# Patient Record
Sex: Female | Born: 1970 | Race: White | Hispanic: No | Marital: Married | Smoking: Never smoker
Health system: Southern US, Community
[De-identification: ages and names within clinical notes are randomized; demographics above are authoritative.]

## PROBLEM LIST (undated history)

## (undated) DIAGNOSIS — R627 Adult failure to thrive: Secondary | ICD-10-CM

## (undated) DIAGNOSIS — F419 Anxiety disorder, unspecified: Secondary | ICD-10-CM

## (undated) DIAGNOSIS — D6861 Antiphospholipid syndrome: Secondary | ICD-10-CM

## (undated) DIAGNOSIS — R1310 Dysphagia, unspecified: Secondary | ICD-10-CM

## (undated) DIAGNOSIS — F32A Depression, unspecified: Secondary | ICD-10-CM

## (undated) DIAGNOSIS — G43009 Migraine without aura, not intractable, without status migrainosus: Secondary | ICD-10-CM

## (undated) DIAGNOSIS — R490 Dysphonia: Secondary | ICD-10-CM

## (undated) DIAGNOSIS — I639 Cerebral infarction, unspecified: Secondary | ICD-10-CM

## (undated) DIAGNOSIS — M329 Systemic lupus erythematosus, unspecified: Secondary | ICD-10-CM

---

## 2020-10-05 ENCOUNTER — Other Ambulatory Visit: Payer: Self-pay

## 2020-10-05 ENCOUNTER — Emergency Department: Payer: Medicare FFS

## 2020-10-05 ENCOUNTER — Inpatient Hospital Stay
Admission: EM | Admit: 2020-10-05 | Discharge: 2020-10-06 | DRG: 308 | Disposition: A | Discharge status: Other Acute Inpatient Hospital | Payer: Medicare FFS | Attending: Internal Medicine | Admitting: Internal Medicine

## 2020-10-05 ENCOUNTER — Encounter: Payer: Self-pay | Admitting: *Deleted

## 2020-10-05 ENCOUNTER — Encounter
Admission: EM | Disposition: A | Discharge status: Other Acute Inpatient Hospital | Payer: Self-pay | Source: Home / Self Care | Attending: Internal Medicine

## 2020-10-05 DIAGNOSIS — J383 Other diseases of vocal cords: Secondary | ICD-10-CM | POA: Diagnosis present

## 2020-10-05 DIAGNOSIS — I442 Atrioventricular block, complete: Secondary | ICD-10-CM | POA: Diagnosis not present

## 2020-10-05 DIAGNOSIS — Z86718 Personal history of other venous thrombosis and embolism: Secondary | ICD-10-CM | POA: Diagnosis not present

## 2020-10-05 DIAGNOSIS — Z8774 Personal history of (corrected) congenital malformations of heart and circulatory system: Secondary | ICD-10-CM | POA: Diagnosis not present

## 2020-10-05 DIAGNOSIS — R570 Cardiogenic shock: Secondary | ICD-10-CM | POA: Diagnosis present

## 2020-10-05 DIAGNOSIS — Z20822 Contact with and (suspected) exposure to covid-19: Secondary | ICD-10-CM | POA: Diagnosis present

## 2020-10-05 DIAGNOSIS — E860 Dehydration: Secondary | ICD-10-CM | POA: Diagnosis present

## 2020-10-05 DIAGNOSIS — R7989 Other specified abnormal findings of blood chemistry: Secondary | ICD-10-CM | POA: Diagnosis present

## 2020-10-05 DIAGNOSIS — G9349 Other encephalopathy: Secondary | ICD-10-CM | POA: Diagnosis present

## 2020-10-05 DIAGNOSIS — R627 Adult failure to thrive: Secondary | ICD-10-CM | POA: Diagnosis present

## 2020-10-05 DIAGNOSIS — R159 Full incontinence of feces: Secondary | ICD-10-CM | POA: Diagnosis present

## 2020-10-05 DIAGNOSIS — R1310 Dysphagia, unspecified: Secondary | ICD-10-CM | POA: Diagnosis present

## 2020-10-05 DIAGNOSIS — E43 Unspecified severe protein-calorie malnutrition: Secondary | ICD-10-CM | POA: Diagnosis present

## 2020-10-05 DIAGNOSIS — R197 Diarrhea, unspecified: Secondary | ICD-10-CM | POA: Diagnosis present

## 2020-10-05 DIAGNOSIS — R490 Dysphonia: Secondary | ICD-10-CM | POA: Diagnosis present

## 2020-10-05 DIAGNOSIS — M797 Fibromyalgia: Secondary | ICD-10-CM | POA: Diagnosis present

## 2020-10-05 DIAGNOSIS — D6861 Antiphospholipid syndrome: Secondary | ICD-10-CM | POA: Diagnosis present

## 2020-10-05 DIAGNOSIS — Z931 Gastrostomy status: Secondary | ICD-10-CM

## 2020-10-05 DIAGNOSIS — T829XXA Unspecified complication of cardiac and vascular prosthetic device, implant and graft, initial encounter: Secondary | ICD-10-CM

## 2020-10-05 DIAGNOSIS — R64 Cachexia: Secondary | ICD-10-CM | POA: Diagnosis present

## 2020-10-05 DIAGNOSIS — F32A Depression, unspecified: Secondary | ICD-10-CM | POA: Diagnosis present

## 2020-10-05 DIAGNOSIS — F419 Anxiety disorder, unspecified: Secondary | ICD-10-CM | POA: Diagnosis present

## 2020-10-05 DIAGNOSIS — Z0189 Encounter for other specified special examinations: Secondary | ICD-10-CM

## 2020-10-05 DIAGNOSIS — Z681 Body mass index (BMI) 19 or less, adult: Secondary | ICD-10-CM

## 2020-10-05 DIAGNOSIS — M1990 Unspecified osteoarthritis, unspecified site: Secondary | ICD-10-CM | POA: Diagnosis present

## 2020-10-05 DIAGNOSIS — Z7901 Long term (current) use of anticoagulants: Secondary | ICD-10-CM | POA: Diagnosis not present

## 2020-10-05 DIAGNOSIS — Z8673 Personal history of transient ischemic attack (TIA), and cerebral infarction without residual deficits: Secondary | ICD-10-CM | POA: Diagnosis not present

## 2020-10-05 DIAGNOSIS — I248 Other forms of acute ischemic heart disease: Secondary | ICD-10-CM | POA: Diagnosis present

## 2020-10-05 DIAGNOSIS — M329 Systemic lupus erythematosus, unspecified: Secondary | ICD-10-CM | POA: Diagnosis present

## 2020-10-05 DIAGNOSIS — R1110 Vomiting, unspecified: Secondary | ICD-10-CM | POA: Diagnosis present

## 2020-10-05 HISTORY — DX: Depression, unspecified: F32.A

## 2020-10-05 HISTORY — DX: Adult failure to thrive: R62.7

## 2020-10-05 HISTORY — DX: Dysphonia: R49.0

## 2020-10-05 HISTORY — DX: Dysphagia, unspecified: R13.10

## 2020-10-05 HISTORY — DX: Cerebral infarction, unspecified: I63.9

## 2020-10-05 HISTORY — DX: Migraine without aura, not intractable, without status migrainosus: G43.009

## 2020-10-05 HISTORY — DX: Anxiety disorder, unspecified: F41.9

## 2020-10-05 HISTORY — DX: Systemic lupus erythematosus, unspecified: M32.9

## 2020-10-05 HISTORY — DX: Antiphospholipid syndrome: D68.61

## 2020-10-05 HISTORY — PX: TEMPORARY PACEMAKER: CATH118268

## 2020-10-05 LAB — CBC WITH DIFFERENTIAL/PLATELET
Abs Immature Granulocytes: 0.05 10*3/uL (ref 0.00–0.07)
Basophils Absolute: 0 10*3/uL (ref 0.0–0.1)
Basophils Relative: 0 %
Eosinophils Absolute: 0 10*3/uL (ref 0.0–0.5)
Eosinophils Relative: 0 %
HCT: 35.8 % — ABNORMAL LOW (ref 36.0–46.0)
Hemoglobin: 11.9 g/dL — ABNORMAL LOW (ref 12.0–15.0)
Immature Granulocytes: 1 %
Lymphocytes Relative: 34 %
Lymphs Abs: 3.3 10*3/uL (ref 0.7–4.0)
MCH: 33 pg (ref 26.0–34.0)
MCHC: 33.2 g/dL (ref 30.0–36.0)
MCV: 99.2 fL (ref 80.0–100.0)
Monocytes Absolute: 1.1 10*3/uL — ABNORMAL HIGH (ref 0.1–1.0)
Monocytes Relative: 11 %
Neutro Abs: 5.3 10*3/uL (ref 1.7–7.7)
Neutrophils Relative %: 54 %
Platelets: 231 10*3/uL (ref 150–400)
RBC: 3.61 MIL/uL — ABNORMAL LOW (ref 3.87–5.11)
RDW: 13.5 % (ref 11.5–15.5)
Smear Review: NORMAL
WBC: 9.9 10*3/uL (ref 4.0–10.5)
nRBC: 0 % (ref 0.0–0.2)

## 2020-10-05 LAB — PROTIME-INR
INR: 1.6 — ABNORMAL HIGH (ref 0.8–1.2)
Prothrombin Time: 18.8 seconds — ABNORMAL HIGH (ref 11.4–15.2)

## 2020-10-05 LAB — URINALYSIS, COMPLETE (UACMP) WITH MICROSCOPIC
Bilirubin Urine: NEGATIVE
Glucose, UA: NEGATIVE mg/dL
Hgb urine dipstick: NEGATIVE
Ketones, ur: NEGATIVE mg/dL
Leukocytes,Ua: NEGATIVE
Nitrite: NEGATIVE
Protein, ur: 100 mg/dL — AB
Specific Gravity, Urine: 1.017 (ref 1.005–1.030)
pH: 7 (ref 5.0–8.0)

## 2020-10-05 LAB — COMPREHENSIVE METABOLIC PANEL
ALT: 108 U/L — ABNORMAL HIGH (ref 0–44)
AST: 73 U/L — ABNORMAL HIGH (ref 15–41)
Albumin: 3.2 g/dL — ABNORMAL LOW (ref 3.5–5.0)
Alkaline Phosphatase: 57 U/L (ref 38–126)
Anion gap: 7 (ref 5–15)
BUN: 25 mg/dL — ABNORMAL HIGH (ref 6–20)
CO2: 22 mmol/L (ref 22–32)
Calcium: 8.5 mg/dL — ABNORMAL LOW (ref 8.9–10.3)
Chloride: 105 mmol/L (ref 98–111)
Creatinine, Ser: 0.73 mg/dL (ref 0.44–1.00)
GFR, Estimated: >60 mL/min (ref >60)
Glucose, Bld: 182 mg/dL — ABNORMAL HIGH (ref 70–99)
Potassium: 4.1 mmol/L (ref 3.5–5.1)
Sodium: 134 mmol/L — ABNORMAL LOW (ref 135–145)
Total Bilirubin: 0.6 mg/dL (ref 0.3–1.2)
Total Protein: 5.5 g/dL — ABNORMAL LOW (ref 6.5–8.1)

## 2020-10-05 LAB — URINE DRUG SCREEN, QUALITATIVE (ARMC ONLY)
Amphetamines, Ur Screen: NOT DETECTED
Barbiturates, Ur Screen: NOT DETECTED
Benzodiazepine, Ur Scrn: NOT DETECTED
Cannabinoid 50 Ng, Ur ~~LOC~~: NOT DETECTED
Cocaine Metabolite,Ur ~~LOC~~: NOT DETECTED
MDMA (Ecstasy)Ur Screen: NOT DETECTED
Methadone Scn, Ur: NOT DETECTED
Opiate, Ur Screen: NOT DETECTED
Phencyclidine (PCP) Ur S: NOT DETECTED
Tricyclic, Ur Screen: NOT DETECTED

## 2020-10-05 LAB — TROPONIN I (HIGH SENSITIVITY): Troponin I (High Sensitivity): 90 ng/L — ABNORMAL HIGH (ref <18)

## 2020-10-05 LAB — SALICYLATE LEVEL: Salicylate Lvl: <7 mg/dL — ABNORMAL LOW (ref 7.0–30.0)

## 2020-10-05 SURGERY — TEMPORARY PACEMAKER

## 2020-10-05 MED ORDER — POLYETHYLENE GLYCOL 3350 17 G PO PACK: 17.0000 g | PACK | Freq: Every day | ORAL | Status: DC | PRN

## 2020-10-05 MED ORDER — FENTANYL CITRATE (PF) 100 MCG/2ML IJ SOLN: 50.0000 ug | INTRAMUSCULAR | Status: DC | PRN

## 2020-10-05 MED ORDER — ONDANSETRON HCL 4 MG/2ML IJ SOLN
INTRAMUSCULAR | Status: AC
  Administered 2020-10-05: 4 mg via INTRAVENOUS
  Filled 2020-10-05: qty 4

## 2020-10-05 MED ORDER — DOCUSATE SODIUM 100 MG PO CAPS
100.0000 mg | ORAL_CAPSULE | Freq: Two times a day (BID) | ORAL | Status: DC | PRN
  Filled 2020-10-05: qty 1

## 2020-10-05 MED ORDER — FENTANYL CITRATE (PF) 100 MCG/2ML IJ SOLN
50.0000 ug | INTRAMUSCULAR | Status: DC | PRN
  Filled 2020-10-05: qty 2

## 2020-10-05 MED ORDER — FAMOTIDINE IN NACL 20-0.9 MG/50ML-% IV SOLN
20.0000 mg | Freq: Two times a day (BID) | INTRAVENOUS | Status: DC
  Administered 2020-10-06 (×2): 20 mg via INTRAVENOUS
  Filled 2020-10-05 (×2): qty 50

## 2020-10-05 MED ORDER — SODIUM BICARBONATE 8.4 % IV SOLN
INTRAVENOUS | Status: AC | PRN
  Administered 2020-10-05: 50 meq via INTRAVENOUS

## 2020-10-05 MED ORDER — FENTANYL 2500MCG IN NS 250ML (10MCG/ML) PREMIX INFUSION
0.0000 ug/h | INTRAVENOUS | Status: DC
  Administered 2020-10-05: 25 ug/h via INTRAVENOUS
  Administered 2020-10-06: 75 ug/h via INTRAVENOUS
  Filled 2020-10-05: qty 250

## 2020-10-05 MED ORDER — ATROPINE SULFATE 1 MG/ML IJ SOLN
INTRAMUSCULAR | Status: AC | PRN
  Administered 2020-10-05: 1 mg via INTRAVENOUS

## 2020-10-05 MED ORDER — LORAZEPAM 2 MG/ML IJ SOLN
2.0000 mg | Freq: Once | INTRAMUSCULAR | Status: DC
  Filled 2020-10-05: qty 1

## 2020-10-05 MED ORDER — LIDOCAINE HCL (PF) 1 % IJ SOLN
INTRAMUSCULAR | Status: AC
  Filled 2020-10-05: qty 30

## 2020-10-05 MED ORDER — PROPOFOL 1000 MG/100ML IV EMUL: 0.0000 ug/kg/min | INTRAVENOUS | Status: DC

## 2020-10-05 MED ORDER — LORAZEPAM 2 MG/ML IJ SOLN: 2.0000 mg | INTRAMUSCULAR | Status: DC | PRN

## 2020-10-05 MED ORDER — FENTANYL CITRATE (PF) 100 MCG/2ML IJ SOLN
INTRAMUSCULAR | Status: AC | PRN
  Administered 2020-10-05: 50 ug via INTRAVENOUS

## 2020-10-05 MED ORDER — SODIUM CHLORIDE 0.9 % IV SOLN
INTRAVENOUS | Status: AC | PRN
  Administered 2020-10-05: 2000 mL via INTRAVENOUS

## 2020-10-05 MED ORDER — DOPAMINE-DEXTROSE 3.2-5 MG/ML-% IV SOLN
INTRAVENOUS | Status: AC
  Filled 2020-10-05: qty 250

## 2020-10-05 MED ORDER — ROCURONIUM BROMIDE 50 MG/5ML IV SOLN
INTRAVENOUS | Status: AC | PRN
  Administered 2020-10-05: 50 mg via INTRAVENOUS

## 2020-10-05 MED ORDER — LIDOCAINE HCL (PF) 1 % IJ SOLN
INTRAMUSCULAR | Status: DC | PRN
  Administered 2020-10-05: 20 mL
  Administered 2020-10-06: 10 mL

## 2020-10-05 MED ORDER — NOREPINEPHRINE 4 MG/250ML-% IV SOLN
INTRAVENOUS | Status: AC
  Filled 2020-10-05: qty 250

## 2020-10-05 MED ORDER — ETOMIDATE 2 MG/ML IV SOLN
INTRAVENOUS | Status: AC | PRN
  Administered 2020-10-05: 15 mg via INTRAVENOUS

## 2020-10-05 MED ORDER — CALCIUM CHLORIDE 10 % IV SOLN
INTRAVENOUS | Status: AC | PRN
  Administered 2020-10-05: 1 g via INTRAVENOUS

## 2020-10-05 MED ORDER — ONDANSETRON HCL 4 MG/2ML IJ SOLN: 4.0000 mg | Freq: Once | INTRAMUSCULAR | Status: AC

## 2020-10-05 SURGICAL SUPPLY — 6 items
CABLE ADAPT PACING TEMP 12FT (ADAPTER) ×4 IMPLANT
NEEDLE PERC 18GX7CM (NEEDLE) ×2 IMPLANT
PACK CARDIAC CATH (CUSTOM PROCEDURE TRAY) ×2 IMPLANT
SHEATH AVANTI 6FR X 11CM (SHEATH) ×4 IMPLANT
SLEEVE REPOSITIONING LENGTH 30 (MISCELLANEOUS) ×2 IMPLANT
WIRE PACING TEMP ST TIP 5 (CATHETERS) ×4 IMPLANT

## 2020-10-05 NOTE — ED Notes (Signed)
Patient transported to CT 

## 2020-10-05 NOTE — ED Provider Notes (Signed)
[facility] Emergency Department Provider Note  ____________________________________________  Time seen: Approximately 10:02 PM  I have reviewed the triage vital signs and the nursing notes.   HISTORY  Chief Complaint No chief complaint on file.    Level 5 Caveat: Portions of the History and Physical including HPI and review of systems are unable to be completely obtained due to acute critical illness and altered mental status  HPI Mallory Riley is a 50 y.o. female who was brought to the ED for shortness of breath and chest pain that started this afternoon rather abruptly.  She has been having malaise and diarrhea previous to that.  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  She has a PEG tube and is chronically underweight.  She is on warfarin for unclear reasons.  No known fall or trauma.  Normally she is ambulatory and independent with ADLs.    No past medical history on file.   Patient Active Problem List   Diagnosis Date Noted   Complete heart block (HCC) 10/05/2020        Prior to Admission medications   Not on File  Warfarin   Allergies Patient has no known allergies.   No family history on file.  Social History Social History   Tobacco Use   Smoking status: Never   Smokeless tobacco: Never  Substance Use Topics   Alcohol use: Never   Drug use: Never    Review of Systems Level 5 Caveat: Portions of the History and Physical including HPI and review of systems are unable to be completely obtained due to patient being a poor historian   Constitutional:   No known fever.  ENT:   No rhinorrhea. Cardiovascular:   Positive chest pain without syncope. Respiratory:   Positive shortness of breath without cough. Gastrointestinal:   Negative for abdominal pain, vomiting and diarrhea.  Musculoskeletal:   Negative for focal pain or swelling ____________________________________________   PHYSICAL EXAM:  VITAL  SIGNS: ED Triage Vitals  Enc Vitals Group     BP 10/05/20 2118 (!) 69/49     Pulse Rate 10/05/20 2137 (!) 31     Resp 10/05/20 2118 (!) 24     Temp 10/05/20 2118 98.3 F (36.8 C)     Temp Source 10/05/20 2118 Oral     SpO2 10/05/20 2137 100 %     Weight 10/05/20 2119 90 lb (40.8 kg)     Height 10/05/20 2119 5\' 4"  (1.626 m)     Head Circumference --      Peak Flow --      Pain Score --      Pain Loc --      Pain Edu? --      Excl. in GC? --     Vital signs reviewed, nursing assessments reviewed.   Constitutional:   Not alert, not oriented, ill-appearing and in extremis.  Cachectic Eyes:   Conjunctivae are normal. EOMI. PERRL. ENT      Head:   Normocephalic and atraumatic.      Nose:   No congestion/rhinnorhea.       Mouth/Throat:   Dry mucous membranes, no pharyngeal erythema. No peritonsillar mass.       Neck:   No meningismus. Full ROM. Hematological/Lymphatic/Immunilogical:   No cervical lymphadenopathy. Cardiovascular:   Bradycardia heart rate 20. Symmetric bilateral thready radial pulses.  Extremities are pale and cool to the touch. Respiratory:   Tachypnea, heart rate 24, breath sounds clear  bilaterally. Gastrointestinal:   Soft and nontender.  PEG tube in position, skin entry site noninflamed.  Non distended.   No rebound, rigidity, or guarding. Genitourinary:   Normal Musculoskeletal:   Normal range of motion in all extremities. No joint effusions.  No lower extremity tenderness.  No edema. Neurologic:   Groaning, not able to follow commands Motor grossly intact.  Skin:    Skin is cool, dry and intact. No rash noted.  No petechiae, purpura, or bullae.  ____________________________________________    LABS (pertinent positives/negatives) (all labs ordered are listed, but only abnormal results are displayed) Labs Reviewed  COMPREHENSIVE METABOLIC PANEL - Abnormal; Notable for the following components:      Result Value   Sodium 134 (*)    Glucose, Bld 182 (*)     BUN 25 (*)    Calcium 8.5 (*)    Total Protein 5.5 (*)    Albumin 3.2 (*)    AST 73 (*)    ALT 108 (*)    All other components within normal limits  PROTIME-INR - Abnormal; Notable for the following components:   Prothrombin Time 18.8 (*)    INR 1.6 (*)    All other components within normal limits  SALICYLATE LEVEL - Abnormal; Notable for the following components:   Salicylate Lvl <7.0 (*)    All other components within normal limits  CBC WITH DIFFERENTIAL/PLATELET - Abnormal; Notable for the following components:   RBC 3.61 (*)    Hemoglobin 11.9 (*)    HCT 35.8 (*)    Monocytes Absolute 1.1 (*)    All other components within normal limits  URINALYSIS, COMPLETE (UACMP) WITH MICROSCOPIC - Abnormal; Notable for the following components:   Color, Urine YELLOW (*)    APPearance CLOUDY (*)    Protein, ur 100 (*)    Bacteria, UA RARE (*)    All other components within normal limits  TROPONIN I (HIGH SENSITIVITY) - Abnormal; Notable for the following components:   Troponin I (High Sensitivity) 90 (*)    All other components within normal limits  RESP PANEL BY RT-PCR (FLU A&B, COVID) ARPGX2  URINE DRUG SCREEN, QUALITATIVE ([facility] ONLY)  HIV ANTIBODY (ROUTINE TESTING W REFLEX)  CBC  BASIC METABOLIC PANEL  BLOOD GAS, ARTERIAL  MAGNESIUM  PHOSPHORUS  TRIGLYCERIDES  TROPONIN I (HIGH SENSITIVITY)   ____________________________________________   EKG  Interpreted by me Complete heart block with ventricular rate of 24.  Artifact and sparse complexes limit interpretation.  There is inferior ST depression.  No clear STEMI.  ____________________________________________    RADIOLOGY  DG Abd 1 View  Result Date: 10/05/2020 CLINICAL DATA:  Shortness of breath, chest pain.  NG tube placement EXAM: ABDOMEN - 1 VIEW COMPARISON:  None. FINDINGS: NG tube tip is in the stomach. Gastrostomy tube projects over the stomach. Lung bases clear. IMPRESSION: NG tube tip in the stomach.  Electronically Signed   By: Charlett Nose M.D.   On: 10/05/2020 22:14   CT Head Wo Contrast  Result Date: 10/05/2020 CLINICAL DATA:  Encephalopathy EXAM: CT HEAD WITHOUT CONTRAST TECHNIQUE: Contiguous axial images were obtained from the base of the skull through the vertex without intravenous contrast. COMPARISON:  None. FINDINGS: Brain: There is no mass, hemorrhage or extra-axial collection. The size and configuration of the ventricles and extra-axial CSF spaces are normal. There is hypoattenuation of the white matter, most commonly indicating chronic small vessel disease. Vascular: No abnormal hyperdensity of the major intracranial arteries or dural venous sinuses.  No intracranial atherosclerosis. Skull: The visualized skull base, calvarium and extracranial soft tissues are normal. Sinuses/Orbits: No fluid levels or advanced mucosal thickening of the visualized paranasal sinuses. No mastoid or middle ear effusion. The orbits are normal. IMPRESSION: Chronic small vessel disease without acute intracranial abnormality. Electronically Signed   By: Deatra RobinsonKevin  Herman M.D.   On: 10/05/2020 22:18   DG Chest Portable 1 View  Result Date: 10/05/2020 CLINICAL DATA:  Intubation.  Shortness of breath, chest pain EXAM: PORTABLE CHEST 1 VIEW COMPARISON:  None. FINDINGS: Endotracheal tube 5 cm above the carina. NG tube is in the stomach. Heart is normal size. No confluent opacities or effusions. No acute bony abnormality. IMPRESSION: No acute cardiopulmonary disease. Electronically Signed   By: Charlett NoseKevin  Dover M.D.   On: 10/05/2020 22:13    ____________________________________________   PROCEDURES .Critical Care  Date/Time: 10/05/2020 10:04 PM Performed by: Sharman CheekStafford, Quenisha Lovins, MD Authorized by: Sharman CheekStafford, Rockie Vawter, MD   Critical care provider statement:    Critical care time (minutes):  40   Critical care time was exclusive of:  Separately billable procedures and treating other patients   Critical care was necessary to  treat or prevent imminent or life-threatening deterioration of the following conditions:  Cardiac failure, circulatory failure, CNS failure or compromise and shock   Critical care was time spent personally by me on the following activities:  Development of treatment plan with patient or surrogate, discussions with consultants, evaluation of patient's response to treatment, examination of patient, obtaining history from patient or surrogate, ordering and performing treatments and interventions, ordering and review of laboratory studies, ordering and review of radiographic studies, pulse oximetry, re-evaluation of patient's condition and review of old charts Comments:        Procedure Name: Intubation Date/Time: 10/05/2020 10:04 PM Performed by: Sharman CheekStafford, Joh Rao, MD Pre-anesthesia Checklist: Patient identified, Patient being monitored, Emergency Drugs available, Timeout performed and Suction available Oxygen Delivery Method: Non-rebreather mask Preoxygenation: Pre-oxygenation with 100% oxygen Induction Type: Rapid sequence Ventilation: Mask ventilation without difficulty Laryngoscope Size: Glidescope and 3 Grade View: Grade I Tube type: Subglottic suction tube Tube size: 7.5 mm Number of attempts: 1 Placement Confirmation: ETT inserted through vocal cords under direct vision, CO2 detector and Breath sounds checked- equal and bilateral Secured at: 20 cm Tube secured with: ETT holder Dental Injury: Teeth and Oropharynx as per pre-operative assessment  Comments: Upper denture removed and saved for patient      External pacer  Date/Time: 10/05/2020 10:05 PM Performed by: Sharman CheekStafford, Wister Hoefle, MD Authorized by: Sharman CheekStafford, Davien Malone, MD  Consent: The procedure was performed in an emergent situation. Local anesthesia used: no  Anesthesia: Local anesthesia used: no  Sedation: Patient sedated: no  Patient tolerance: patient tolerated the procedure well with no immediate  complications Comments: Patient perimortem necessitating immediate pacing.  Given 50 mcg IV fentanyl.    ____________________________________________  DIFFERENTIAL DIAGNOSIS   ACS, complete heart block, electrolyte normality, dehydration, sepsis, intracranial hemorrhage  CLINICAL IMPRESSION / ASSESSMENT AND PLAN / ED COURSE  Medications ordered in the ED: Medications  LORazepam (ATIVAN) injection 2 mg ( Intravenous MAR Hold 10/05/20 2326)  fentaNYL 2500mcg in NS 250mL (5210mcg/ml) infusion-PREMIX (25 mcg/hr Intravenous New Bag/Given 10/05/20 2223)  LORazepam (ATIVAN) injection 2 mg ( Intravenous MAR Hold 10/05/20 2326)  lidocaine (PF) (XYLOCAINE) 1 % injection (10 mLs Infiltration Given 10/06/20 0010)  docusate sodium (COLACE) capsule 100 mg (has no administration in time range)  polyethylene glycol (MIRALAX / GLYCOLAX) packet 17 g (has no administration in time  range)  famotidine (PEPCID) IVPB 20 mg premix (has no administration in time range)  fentaNYL (SUBLIMAZE) injection 50 mcg (has no administration in time range)  fentaNYL (SUBLIMAZE) injection 50-200 mcg (has no administration in time range)  propofol (DIPRIVAN) 1000 MG/100ML infusion (has no administration in time range)  0.9 %  sodium chloride infusion ( Intravenous Stopped 10/05/20 2224)  atropine injection (1 mg Intravenous Given 10/05/20 2134)  sodium bicarbonate injection (50 mEq Intravenous Given 10/05/20 2136)  fentaNYL (SUBLIMAZE) injection (50 mcg Intravenous Given 10/05/20 2140)  calcium chloride injection (1 g Intravenous Given 10/05/20 2136)  ondansetron (ZOFRAN) injection 4 mg (4 mg Intravenous Given 10/05/20 2141)  etomidate (AMIDATE) injection (15 mg Intravenous Given 10/05/20 2145)  rocuronium (ZEMURON) injection (50 mg Intravenous Given 10/05/20 2146)    Pertinent labs & imaging results that were available during my care of the patient were reviewed by me and considered in my medical decision making (see chart for  details).   Mallory Riley was evaluated in Emergency Department on 10/06/2020 for the symptoms described in the history of present illness. She was evaluated in the context of the global COVID-19 pandemic, which necessitated consideration that the patient might be at risk for infection with the SARS-CoV-2 virus that causes COVID-19. Institutional protocols and algorithms that pertain to the evaluation of patients at risk for COVID-19 are in a state of rapid change based on information released by regulatory bodies including the CDC and federal and state organizations. These policies and algorithms were followed during the patient's care in the ED.     Clinical Course as of 10/06/20 0025  Wed Oct 05, 2020  2156 Patient arrived in extremis, EKG showing complete heart block with a ventricular rate about 20.  Patient was hypotensive with a blood pressure of 70/50, groaning and agonal.  Given immediate atropine, calcium and bicarb.  Started transcutaneous pacing with good capture at 70 mA for a peripheral pulse rate of 80 with normalization of blood pressure.  She did start to vomit, and with critical illness, she was intubated for airway protection.  My colleague Dr. Alfred Levins discussed the case with cardiology Dr. Juliann Pares who will plan for emergency transvenous pacer insertion.  When husband brings med list, it includes warfarin.  No history of fall or head trauma, but presentation necessitates CT head to rule out massive ICH. [PS]  2211 Chest and abdomen x-ray viewed and interpreted by me, lungs inflated, no pneumothorax or subcu emphysema.  ET tube terminates 4.5 cm above the carina.  OG tube projects over the stomach. [PS]  2218 CT head reviewed by me, no obvious ICH. [PS]    Clinical Course User Index [PS] Sharman Cheek, MD     ____________________________________________   FINAL CLINICAL IMPRESSION(S) / ED DIAGNOSES    Final diagnoses:  Heart block AV complete [facility])     ED Discharge  Orders     None       Portions of this note were generated with dragon dictation software. Dictation errors may occur despite best attempts at proofreading.   Sharman Cheek, MD 10/06/20 [place] Manners

## 2020-10-05 NOTE — ED Triage Notes (Signed)
Pt to triage via wheelchair.  Pt reports sob and chest pain.  Husband states diarrhea x 1   pt has a feeding tube.  Pt is pale and clammy.

## 2020-10-05 NOTE — ED Notes (Signed)
Pt has returned from CT without incident 

## 2020-10-05 NOTE — ED Notes (Signed)
2 ivs started  In triage.  Pt moaning out I can't breathe  Family with pt.

## 2020-10-05 NOTE — ED Notes (Signed)
Blue shirt, floral pants, pair of socks and upper denture removed from patient, placed in bag and given to husband.

## 2020-10-06 ENCOUNTER — Inpatient Hospital Stay
Admit: 2020-10-06 | Discharge: 2020-10-06 | Disposition: A | Discharge status: Home / Self Care | Payer: Medicare FFS | Attending: Nurse Practitioner | Admitting: Nurse Practitioner

## 2020-10-06 ENCOUNTER — Inpatient Hospital Stay: Payer: Medicare FFS

## 2020-10-06 ENCOUNTER — Encounter: Payer: Self-pay | Admitting: Internal Medicine

## 2020-10-06 ENCOUNTER — Inpatient Hospital Stay: Admit: 2020-10-06 | Payer: Medicare FFS

## 2020-10-06 ENCOUNTER — Encounter
Admission: EM | Disposition: A | Discharge status: Other Acute Inpatient Hospital | Payer: Self-pay | Source: Home / Self Care | Attending: Internal Medicine

## 2020-10-06 DIAGNOSIS — I442 Atrioventricular block, complete: Principal | ICD-10-CM

## 2020-10-06 HISTORY — PX: TEMPORARY PACEMAKER: CATH118268

## 2020-10-06 LAB — COMPREHENSIVE METABOLIC PANEL
ALT: 285 U/L — ABNORMAL HIGH (ref 0–44)
AST: 206 U/L — ABNORMAL HIGH (ref 15–41)
Albumin: 2.9 g/dL — ABNORMAL LOW (ref 3.5–5.0)
Alkaline Phosphatase: 104 U/L (ref 38–126)
Anion gap: 5 (ref 5–15)
BUN: 25 mg/dL — ABNORMAL HIGH (ref 6–20)
CO2: 25 mmol/L (ref 22–32)
Calcium: 8.2 mg/dL — ABNORMAL LOW (ref 8.9–10.3)
Chloride: 108 mmol/L (ref 98–111)
Creatinine, Ser: 0.67 mg/dL (ref 0.44–1.00)
GFR, Estimated: >60 mL/min (ref >60)
Glucose, Bld: 254 mg/dL — ABNORMAL HIGH (ref 70–99)
Potassium: 4.1 mmol/L (ref 3.5–5.1)
Sodium: 138 mmol/L (ref 135–145)
Total Bilirubin: 0.6 mg/dL (ref 0.3–1.2)
Total Protein: 5.2 g/dL — ABNORMAL LOW (ref 6.5–8.1)

## 2020-10-06 LAB — BLOOD GAS, ARTERIAL
Acid-base deficit: 1.6 mmol/L (ref 0.0–2.0)
Bicarbonate: 22.9 mmol/L (ref 20.0–28.0)
FIO2: 0.6
MECHVT: 400 mL
O2 Saturation: 99.8 %
PEEP: 5 cmH2O
Patient temperature: 37
RATE: 14 resp/min
pCO2 arterial: 37 mmHg (ref 32.0–48.0)
pH, Arterial: 7.4 (ref 7.350–7.450)
pO2, Arterial: 246 mmHg — ABNORMAL HIGH (ref 83.0–108.0)

## 2020-10-06 LAB — PHOSPHORUS
Phosphorus: 4.9 mg/dL — ABNORMAL HIGH (ref 2.5–4.6)
Phosphorus: 3.3 mg/dL (ref 2.5–4.6)

## 2020-10-06 LAB — ECHOCARDIOGRAM COMPLETE
AR max vel: 2.59 cm2
AV Area VTI: 2.8 cm2
AV Area mean vel: 2.49 cm2
AV Mean grad: 2.5 mmHg
AV Peak grad: 4.6 mmHg
Ao pk vel: 1.07 m/s
Area-P 1/2: 2.87 cm2
Height: 64 in
S' Lateral: 2.69 cm
Weight: 1509.71 oz

## 2020-10-06 LAB — CBC
HCT: 33.8 % — ABNORMAL LOW (ref 36.0–46.0)
Hemoglobin: 11.3 g/dL — ABNORMAL LOW (ref 12.0–15.0)
MCH: 32.8 pg (ref 26.0–34.0)
MCHC: 33.4 g/dL (ref 30.0–36.0)
MCV: 98.3 fL (ref 80.0–100.0)
Platelets: 201 10*3/uL (ref 150–400)
RBC: 3.44 MIL/uL — ABNORMAL LOW (ref 3.87–5.11)
RDW: 13.5 % (ref 11.5–15.5)
WBC: 12.4 10*3/uL — ABNORMAL HIGH (ref 4.0–10.5)
nRBC: 0 % (ref 0.0–0.2)

## 2020-10-06 LAB — HIV ANTIBODY (ROUTINE TESTING W REFLEX): HIV Screen 4th Generation wRfx: NONREACTIVE

## 2020-10-06 LAB — RESP PANEL BY RT-PCR (FLU A&B, COVID) ARPGX2
Influenza A by PCR: NEGATIVE
Influenza B by PCR: NEGATIVE
SARS Coronavirus 2 by RT PCR: NEGATIVE

## 2020-10-06 LAB — GLUCOSE, CAPILLARY
Glucose-Capillary: 98 mg/dL (ref 70–99)
Glucose-Capillary: 90 mg/dL (ref 70–99)
Glucose-Capillary: 103 mg/dL — ABNORMAL HIGH (ref 70–99)
Glucose-Capillary: 91 mg/dL (ref 70–99)

## 2020-10-06 LAB — MRSA NEXT GEN BY PCR, NASAL: MRSA by PCR Next Gen: NOT DETECTED

## 2020-10-06 LAB — TROPONIN I (HIGH SENSITIVITY): Troponin I (High Sensitivity): 159 ng/L (ref <18)

## 2020-10-06 LAB — MAGNESIUM
Magnesium: 1.5 mg/dL — ABNORMAL LOW (ref 1.7–2.4)
Magnesium: 1.9 mg/dL (ref 1.7–2.4)

## 2020-10-06 SURGERY — TEMPORARY PACEMAKER

## 2020-10-06 MED ORDER — ACETAMINOPHEN 160 MG/5ML PO SOLN
650.0000 mg | Freq: Four times a day (QID) | ORAL | Status: DC | PRN
  Administered 2020-10-06 (×2): 650 mg
  Filled 2020-10-06 (×3): qty 20.3

## 2020-10-06 MED ORDER — CHLORHEXIDINE GLUCONATE CLOTH 2 % EX PADS
6.0000 | MEDICATED_PAD | Freq: Every day | CUTANEOUS | Status: DC
  Administered 2020-10-06: 6 via TOPICAL

## 2020-10-06 MED ORDER — MIDAZOLAM HCL 2 MG/2ML IJ SOLN
INTRAMUSCULAR | Status: AC
  Filled 2020-10-06: qty 2

## 2020-10-06 MED ORDER — MIDAZOLAM HCL 2 MG/2ML IJ SOLN
1.0000 mg | INTRAMUSCULAR | Status: DC | PRN
  Filled 2020-10-06: qty 2

## 2020-10-06 MED ORDER — MIDAZOLAM 50MG/50ML (1MG/ML) PREMIX INFUSION
0.5000 mg/h | INTRAVENOUS | Status: DC
  Administered 2020-10-06: 2.5 mg/h via INTRAVENOUS
  Administered 2020-10-06: 6 mg/h via INTRAVENOUS
  Administered 2020-10-06: 4 mg/h via INTRAVENOUS
  Filled 2020-10-06 (×2): qty 50

## 2020-10-06 MED ORDER — THIAMINE HCL 100 MG PO TABS
100.0000 mg | ORAL_TABLET | Freq: Every day | ORAL | Status: DC
  Administered 2020-10-06: 100 mg
  Filled 2020-10-06: qty 1

## 2020-10-06 MED ORDER — LIDOCAINE HCL (PF) 1 % IJ SOLN
INTRAMUSCULAR | Status: DC | PRN
  Administered 2020-10-06: 5 mL

## 2020-10-06 MED ORDER — HEPARIN (PORCINE) IN NACL 1000-0.9 UT/500ML-% IV SOLN
INTRAVENOUS | Status: DC | PRN
  Administered 2020-10-06: 500 mL

## 2020-10-06 MED ORDER — MAGNESIUM SULFATE 2 GM/50ML IV SOLN
2.0000 g | Freq: Once | INTRAVENOUS | Status: AC
  Administered 2020-10-06: 2 g via INTRAVENOUS
  Filled 2020-10-06: qty 50

## 2020-10-06 MED ORDER — HEPARIN BOLUS VIA INFUSION
2600.0000 [IU] | Freq: Once | INTRAVENOUS | Status: AC
  Administered 2020-10-06: 2600 [IU] via INTRAVENOUS
  Filled 2020-10-06: qty 2600

## 2020-10-06 MED ORDER — EPINEPHRINE 1 MG/10ML IJ SOSY
PREFILLED_SYRINGE | INTRAMUSCULAR | Status: DC | PRN
  Administered 2020-10-06: 1 mg via INTRAVENOUS

## 2020-10-06 MED ORDER — HEPARIN (PORCINE) 25000 UT/250ML-% IV SOLN
750.0000 [IU]/h | INTRAVENOUS | Status: DC
  Administered 2020-10-06: 750 [IU]/h via INTRAVENOUS
  Filled 2020-10-06: qty 250

## 2020-10-06 MED ORDER — VASOPRESSIN 20 UNITS/100 ML INFUSION FOR SHOCK
0.0000 [IU]/min | INTRAVENOUS | Status: DC
  Filled 2020-10-06: qty 100

## 2020-10-06 MED ORDER — EPINEPHRINE 1 MG/10ML IJ SOSY
PREFILLED_SYRINGE | INTRAMUSCULAR | Status: AC
  Filled 2020-10-06: qty 10

## 2020-10-06 MED ORDER — FREE WATER
75.0000 mL | Status: DC
  Administered 2020-10-06 (×3): 75 mL

## 2020-10-06 MED ORDER — NOREPINEPHRINE 4 MG/250ML-% IV SOLN
0.0000 ug/min | INTRAVENOUS | Status: DC
  Administered 2020-10-06: 4 ug/min via INTRAVENOUS
  Filled 2020-10-06: qty 250

## 2020-10-06 MED ORDER — NOREPINEPHRINE 4 MG/250ML-% IV SOLN: 0.0000 ug/min | INTRAVENOUS | Status: DC

## 2020-10-06 MED ORDER — ORAL CARE MOUTH RINSE
15.0000 mL | OROMUCOSAL | Status: DC
  Administered 2020-10-06 (×5): 15 mL via OROMUCOSAL

## 2020-10-06 MED ORDER — NOREPINEPHRINE 16 MG/250ML-% IV SOLN
0.0000 ug/min | INTRAVENOUS | Status: DC
  Administered 2020-10-06: 4 ug/min via INTRAVENOUS
  Filled 2020-10-06: qty 250

## 2020-10-06 MED ORDER — CHLORHEXIDINE GLUCONATE 0.12% ORAL RINSE (MEDLINE KIT)
15.0000 mL | Freq: Two times a day (BID) | OROMUCOSAL | Status: DC
  Administered 2020-10-06 (×2): 15 mL via OROMUCOSAL

## 2020-10-06 MED ORDER — OSMOLITE 1.5 CAL PO LIQD
1000.0000 mL | ORAL | Status: DC
  Administered 2020-10-06: 1000 mL

## 2020-10-06 MED ORDER — DOPAMINE-DEXTROSE 3.2-5 MG/ML-% IV SOLN
INTRAVENOUS | Status: AC | PRN
  Administered 2020-10-06: 10 ug/kg/min via INTRAVENOUS

## 2020-10-06 MED ORDER — SODIUM CHLORIDE 0.9 % IV SOLN
INTRAVENOUS | Status: AC | PRN
  Administered 2020-10-06: 250 mL via INTRAVENOUS

## 2020-10-06 MED ORDER — SODIUM CHLORIDE 0.9 % IV SOLN
INTRAVENOUS | Status: AC | PRN
  Administered 2020-10-06: 10 mL/h via INTRAVENOUS

## 2020-10-06 MED ORDER — MIDAZOLAM HCL 2 MG/2ML IJ SOLN
INTRAMUSCULAR | Status: DC | PRN
Start: 2020-10-06 — End: 2020-10-06
  Administered 2020-10-06: 1 mg via INTRAVENOUS

## 2020-10-06 MED ORDER — DOPAMINE-DEXTROSE 3.2-5 MG/ML-% IV SOLN
0.0000 ug/kg/min | INTRAVENOUS | Status: DC
Start: 2020-10-06 — End: 2020-10-07
  Administered 2020-10-06 (×2): 20 ug/kg/min via INTRAVENOUS
  Filled 2020-10-06: qty 250

## 2020-10-06 MED ORDER — DOPAMINE-DEXTROSE 3.2-5 MG/ML-% IV SOLN
INTRAVENOUS | Status: AC
  Filled 2020-10-06: qty 250

## 2020-10-06 MED ORDER — LIDOCAINE HCL (PF) 1 % IJ SOLN
INTRAMUSCULAR | Status: AC
  Filled 2020-10-06: qty 30

## 2020-10-06 SURGICAL SUPPLY — 7 items
CABLE ADAPT PACING TEMP 12FT (ADAPTER) ×2 IMPLANT
DRAPE BRACHIAL (DRAPES) ×2 IMPLANT
NEEDLE PERC 18GX7CM (NEEDLE) ×2 IMPLANT
PACK CARDIAC CATH (CUSTOM PROCEDURE TRAY) ×2 IMPLANT
SHEATH AVANTI 6FR X 11CM (SHEATH) ×2 IMPLANT
SLEEVE REPOSITIONING LENGTH 30 (MISCELLANEOUS) ×2 IMPLANT
WIRE PACING TEMP ST TIP 5 (CATHETERS) ×2 IMPLANT

## 2020-10-06 NOTE — Progress Notes (Signed)
[facility] Cardiology    SUBJECTIVE: Intubated sedated   Vitals:   10/06/20 0515 10/06/20 0530 10/06/20 0545 10/06/20 0600  BP:  101/80  103/77  Pulse: 79 82 79 79  Resp: 18 18 17 17   Temp:      TempSrc:      SpO2: 100% 100% 100% 100%  Weight:      Height:         Intake/Output Summary (Last 24 hours) at 10/06/2020 10/08/2020 Last data filed at 10/06/2020 10/08/2020 Gross per 24 hour  Intake 2217.05 ml  Output 500 ml  Net 1717.05 ml      PHYSICAL EXAM  General: Generally ill-appearing cachectic intubated sedated HEENT:  Normocephalic and atramatic Neck:  No JVD.  Lungs: Clear bilaterally to auscultation and percussion.  Intubated on vent Heart: Externally paced. Normal S1 and S2 without gallops or murmurs.  Abdomen: Bowel sounds are positive, abdomen soft and non-tender  Msk:  Back normal, normal gait. Normal strength and tone for age. Extremities: No clubbing, cyanosis or edema.   Neuro: Intubated sedated Psych: Unable to assess patient intubated sedated   LABS: Basic Metabolic Panel: Recent Labs    10/05/20 2123 10/06/20 0350  NA 134* 138  K 4.1 4.1  CL 105 108  CO2 22 25  GLUCOSE 182* 254*  BUN 25* 25*  CREATININE 0.73 0.67  CALCIUM 8.5* 8.2*  MG  --  1.5*  PHOS  --  4.9*   Liver Function Tests: Recent Labs    10/05/20 2123 10/06/20 0350  AST 73* 206*  ALT 108* 285*  ALKPHOS 57 104  BILITOT 0.6 0.6  PROT 5.5* 5.2*  ALBUMIN 3.2* 2.9*   No results for input(s): LIPASE, AMYLASE in the last 72 hours. CBC: Recent Labs    10/05/20 2123 10/06/20 0350  WBC 9.9 12.4*  NEUTROABS 5.3  --   HGB 11.9* 11.3*  HCT 35.8* 33.8*  MCV 99.2 98.3  PLT 231 201   Cardiac Enzymes: No results for input(s): CKTOTAL, CKMB, CKMBINDEX, TROPONINI in the last 72 hours. BNP: Invalid input(s): POCBNP D-Dimer: No results for input(s): DDIMER in the last 72 hours. Hemoglobin A1C: No results for input(s): HGBA1C in the last 72 hours. Fasting Lipid Panel: No results for  input(s): CHOL, HDL, LDLCALC, TRIG, CHOLHDL, LDLDIRECT in the last 72 hours. Thyroid Function Tests: No results for input(s): TSH, T4TOTAL, T3FREE, THYROIDAB in the last 72 hours.  Invalid input(s): FREET3 Anemia Panel: No results for input(s): VITAMINB12, FOLATE, FERRITIN, TIBC, IRON, RETICCTPCT in the last 72 hours.  DG Chest 1 View  Result Date: 10/06/2020 CLINICAL DATA:  50 year old female with chest pain and shortness of breath. Intubated. Pacemaker. EXAM: CHEST  1 VIEW COMPARISON:  10/05/2020 portable chest. FINDINGS: Portable AP semi upright view at 0437 hours. Endotracheal tube tip is stable at the level the clavicles. Enteric tube courses to the abdomen, tip not included. New right chest, subclavian versus IJ approach cardiac pacemaker lead which terminates at the RV apex region (arrows). Multiple EKG leads and pacer pads also project over the chest. Mediastinal contours remain normal. No pneumothorax is identified. Allowing for portable technique the lungs are clear. No acute osseous abnormality identified. IMPRESSION: 1. New right side cardiac pacemaker lead placed, tip at the RV apex region. No pneumothorax. 2. Stable ET tube and enteric tube. 3. No acute cardiopulmonary abnormality. Electronically Signed   By: 10/07/2020 M.D.   On: 10/06/2020 04:49   DG Abd 1 View  Result Date: 10/05/2020  CLINICAL DATA:  Shortness of breath, chest pain.  NG tube placement EXAM: ABDOMEN - 1 VIEW COMPARISON:  None. FINDINGS: NG tube tip is in the stomach. Gastrostomy tube projects over the stomach. Lung bases clear. IMPRESSION: NG tube tip in the stomach. Electronically Signed   By: Charlett Nose M.D.   On: 10/05/2020 22:14   CT Head Wo Contrast  Result Date: 10/05/2020 CLINICAL DATA:  Encephalopathy EXAM: CT HEAD WITHOUT CONTRAST TECHNIQUE: Contiguous axial images were obtained from the base of the skull through the vertex without intravenous contrast. COMPARISON:  None. FINDINGS: Brain: There is no mass,  hemorrhage or extra-axial collection. The size and configuration of the ventricles and extra-axial CSF spaces are normal. There is hypoattenuation of the white matter, most commonly indicating chronic small vessel disease. Vascular: No abnormal hyperdensity of the major intracranial arteries or dural venous sinuses. No intracranial atherosclerosis. Skull: The visualized skull base, calvarium and extracranial soft tissues are normal. Sinuses/Orbits: No fluid levels or advanced mucosal thickening of the visualized paranasal sinuses. No mastoid or middle ear effusion. The orbits are normal. IMPRESSION: Chronic small vessel disease without acute intracranial abnormality. Electronically Signed   By: Deatra Robinson M.D.   On: 10/05/2020 22:18   CARDIAC CATHETERIZATION  Result Date: 10/06/2020  Successful placement of pacer from right EJ to right ventricular apex but unable to get adequate captur external pacer still functioning  Failure to maintain adequate capture from internal temporary pacemaker  Partially successful placement of temporary pacemaker right EJ. Maintain external pacer Patient hypotensive critically ill Fluids given dopamine given 1 amp of epi Patient purposefully movement intubated Critical care team as soon care of this critically ill patient   DG Chest Portable 1 View  Result Date: 10/05/2020 CLINICAL DATA:  Intubation.  Shortness of breath, chest pain EXAM: PORTABLE CHEST 1 VIEW COMPARISON:  None. FINDINGS: Endotracheal tube 5 cm above the carina. NG tube is in the stomach. Heart is normal size. No confluent opacities or effusions. No acute bony abnormality. IMPRESSION: No acute cardiopulmonary disease. Electronically Signed   By: Charlett Nose M.D.   On: 10/05/2020 22:13     Echo pending  TELEMETRY: Paced rhythm external: At 100  ASSESSMENT AND PLAN:  Active Problems:   Complete heart block (HCC) Hypotension Respiratory failure Failure to thrive Generalized  weakness  . Plan Agree with ICU level care Continue ventilatory support Agree with pressure therapy for hypotension Follow-up laboratories CBC electrolytes Echocardiogram will be helpful for left ventricular function wall motion valvular structures Recommend reposition temporary pacemaker to hopefully discontinue external Consider tertiary care referral once temporary pacemaker is in place Case discussed with Dr. Darrold Junker who attempt to place temporary pacer   Alwyn Pea, MD 10/06/2020 9:18 AM

## 2020-10-06 NOTE — Progress Notes (Addendum)
Asked to see patient this afternoon.  Patient has been on warfarin which was discontinued.  Currently is intubated and on dopamine and Levophed for pressure.  Has a temporary pacemaker in place.  Pacing normally.  Would recommend continuing with attempt to transfer to a tertiary care center which apparently is being addressed..  Patient will likely require a permanent pacemaker.  Would defer warfarin at present and start heparin for anticoagulation.  Echocardiogram shows preserved LV function with LVH.  No high-grade valvular disease.

## 2020-10-06 NOTE — Consult Note (Signed)
ANTICOAGULATION CONSULT NOTE - Initial Consult  Pharmacy Consult for Heparin infusion Indication: Antiphospholipid syndrome -- history of VTE  No Known Allergies  Patient Measurements: Heparin Dosing Weight: 42.8 kg  Vital Signs: Temp: 100.58 F (38.1 C) (06/30 1500) Temp Source: Bladder (06/30 1500) BP: 90/64 (06/30 1500) Pulse Rate: 69 (06/30 1500)  Labs: Recent Labs    10/05/20 2123 10/06/20 0350  HGB 11.9* 11.3*  HCT 35.8* 33.8*  PLT 231 201  LABPROT 18.8*  --   INR 1.6*  --   CREATININE 0.73 0.67  TROPONINIHS 90* 159*    Estimated Creatinine Clearance: 56.8 mL/min (by C-G formula based on SCr of 0.67 mg/dL).   Medical History: Past Medical History:  Diagnosis Date   Antiphospholipid antibody syndrome (HCC)    Anxiety and depression    CVA (cerebral vascular accident) (HCC)    Dysphagia    Dysphonia    Failure to thrive in adult    Migraine headache without aura    SLE (systemic lupus erythematosus related syndrome) (HCC)     Medications:  Warfarin -- PTA, last dose unkown (admitted 6/29 1300) --INR 6/29 = 1.6   Assessment: 50 y.o. 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 Pharmacy has been consulted for transition to heparin therapy while patient NPO.   Goal of Therapy:  Heparin level 0.3-0.7 units/ml Monitor platelets by anticoagulation protocol: Yes   Plan:  Give 2600 units bolus x 1 Start heparin infusion at 750 units/hr Check anti-Xa level in 6 hours and daily while on heparin Continue to monitor H&H and platelets  Sharen Hones, PharmD, BCPS Clinical Pharmacist   10/06/2020,3:53 PM

## 2020-10-06 NOTE — Consult Note (Signed)
CARDIOLOGY CONSULT NOTE               Patient ID: Mallory Riley MRN: [id number] DOB/AGE: 1970/12/21 50 y.o.  Admit date: 10/05/2020 Referring Physician Dr. Alfonse Flavors, ER Primary Physician unknown Primary Cardiologist [facility] Dr Naoma Diener Reason for Consultation profound bradycardia  HPI: Patient is a 50 year old female chronically ill 71 antiphospholipid syndrome previous CVA history of PFO closure failure to thrive as an adult chronic migraines anxiety depression has been undergoing extensive work-up by neurology rheumatology gastroenterology for multitude of progressive chronic medical problems patient has been seen and treated in [place] as well as in [place] and [place] felt sick today she has a feeding tube in place and subsequently had what appeared to be an arrest requiring intubation requiring significant support for bradycardia placed on a temporary pacemaker external and internal temporary pacemaker was then requested she denies any previous cardiac history except for PFO closure in the past 2019.  History is obtained from husband patient intubated sedated at time of my interview  Review of systems complete and found to be negative unless listed above     Past Medical History:  Diagnosis Date   Antiphospholipid antibody syndrome (HCC)    Anxiety and depression    CVA (cerebral vascular accident) (HCC)    Dysphagia    Dysphonia    Failure to thrive in adult    Migraine headache without aura    SLE (systemic lupus erythematosus related syndrome) (HCC)     Past Surgical History:  Procedure Laterality Date   TEMPORARY PACEMAKER N/A 10/05/2020   Procedure: TEMPORARY PACEMAKER;  Surgeon: Alwyn Pea, MD;  Location: [facility] INVASIVE CV LAB;  Service: Cardiovascular;  Laterality: N/A;    No medications prior to admission.   Social History   Socioeconomic History   Marital status: Married    Spouse name: Not on file   Number of children: Not on file    Years of education: Not on file   Highest education level: Not on file  Occupational History   Not on file  Tobacco Use   Smoking status: Never   Smokeless tobacco: Never  Substance and Sexual Activity   Alcohol use: Never   Drug use: Never   Sexual activity: Not on file  Other Topics Concern   Not on file  Social History Narrative   Not on file   Social Determinants of Health   Financial Resource Strain: Not on file  Food Insecurity: Not on file  Transportation Needs: Not on file  Physical Activity: Not on file  Stress: Not on file  Social Connections: Not on file  Intimate Partner Violence: Not on file    No family history on file.    Review of systems complete and found to be negative unless listed above      PHYSICAL EXAM  General: Cachectic ill-appearing intubated sedated HEENT:  Normocephalic and atramatic Neck:  No JVD.  Lungs: Clear bilaterally to auscultation and percussion. Heart: Bradycardic. Normal S1 and S2 without gallops or murmurs.  Abdomen: Bowel sounds are positive, abdomen soft and non-tender  Msk:  Back normal, normal gait. Normal strength and tone for age. Extremities: No clubbing, cyanosis or edema.   Neuro: Intubated sedated Psych: Unable to assess intubated sedated  Labs:   Lab Results  Component Value Date   WBC 12.4 (H) 10/06/2020   HGB 11.3 (L) 10/06/2020   HCT 33.8 (L) 10/06/2020   MCV 98.3 10/06/2020   PLT 201  10/06/2020    Recent Labs  Lab 10/06/20 0350  NA 138  K 4.1  CL 108  CO2 25  BUN 25*  CREATININE 0.67  CALCIUM 8.2*  PROT 5.2*  BILITOT 0.6  ALKPHOS 104  ALT 285*  AST 206*  GLUCOSE 254*   No results found for: CKTOTAL, CKMB, CKMBINDEX, TROPONINI No results found for: CHOL No results found for: HDL No results found for: LDLCALC No results found for: TRIG No results found for: CHOLHDL No results found for: LDLDIRECT    Radiology: DG Chest 1 View  Result Date: 10/06/2020 CLINICAL DATA:  50 year old  female with chest pain and shortness of breath. Intubated. Pacemaker. EXAM: CHEST  1 VIEW COMPARISON:  10/05/2020 portable chest. FINDINGS: Portable AP semi upright view at 0437 hours. Endotracheal tube tip is stable at the level the clavicles. Enteric tube courses to the abdomen, tip not included. New right chest, subclavian versus IJ approach cardiac pacemaker lead which terminates at the RV apex region (arrows). Multiple EKG leads and pacer pads also project over the chest. Mediastinal contours remain normal. No pneumothorax is identified. Allowing for portable technique the lungs are clear. No acute osseous abnormality identified. IMPRESSION: 1. New right side cardiac pacemaker lead placed, tip at the RV apex region. No pneumothorax. 2. Stable ET tube and enteric tube. 3. No acute cardiopulmonary abnormality. Electronically Signed   By: Odessa Fleming M.D.   On: 10/06/2020 04:49   DG Abd 1 View  Result Date: 10/05/2020 CLINICAL DATA:  Shortness of breath, chest pain.  NG tube placement EXAM: ABDOMEN - 1 VIEW COMPARISON:  None. FINDINGS: NG tube tip is in the stomach. Gastrostomy tube projects over the stomach. Lung bases clear. IMPRESSION: NG tube tip in the stomach. Electronically Signed   By: Charlett Nose M.D.   On: 10/05/2020 22:14   CT Head Wo Contrast  Result Date: 10/05/2020 CLINICAL DATA:  Encephalopathy EXAM: CT HEAD WITHOUT CONTRAST TECHNIQUE: Contiguous axial images were obtained from the base of the skull through the vertex without intravenous contrast. COMPARISON:  None. FINDINGS: Brain: There is no mass, hemorrhage or extra-axial collection. The size and configuration of the ventricles and extra-axial CSF spaces are normal. There is hypoattenuation of the white matter, most commonly indicating chronic small vessel disease. Vascular: No abnormal hyperdensity of the major intracranial arteries or dural venous sinuses. No intracranial atherosclerosis. Skull: The visualized skull base, calvarium and  extracranial soft tissues are normal. Sinuses/Orbits: No fluid levels or advanced mucosal thickening of the visualized paranasal sinuses. No mastoid or middle ear effusion. The orbits are normal. IMPRESSION: Chronic small vessel disease without acute intracranial abnormality. Electronically Signed   By: Deatra Robinson M.D.   On: 10/05/2020 22:18   CARDIAC CATHETERIZATION  Result Date: 10/06/2020 Successful transvenous temporary pacemaker via right internal jugular vein  CARDIAC CATHETERIZATION  Result Date: 10/06/2020  Successful placement of pacer from right EJ to right ventricular apex but unable to get adequate captur external pacer still functioning  Failure to maintain adequate capture from internal temporary pacemaker  Partially successful placement of temporary pacemaker right EJ. Maintain external pacer Patient hypotensive critically ill Fluids given dopamine given 1 amp of epi Patient purposefully movement intubated Critical care team as soon care of this critically ill patient   DG Chest Portable 1 View  Result Date: 10/05/2020 CLINICAL DATA:  Intubation.  Shortness of breath, chest pain EXAM: PORTABLE CHEST 1 VIEW COMPARISON:  None. FINDINGS: Endotracheal tube 5 cm above the carina. NG  tube is in the stomach. Heart is normal size. No confluent opacities or effusions. No acute bony abnormality. IMPRESSION: No acute cardiopulmonary disease. Electronically Signed   By: Charlett Nose M.D.   On: 10/05/2020 22:13    EKG: Atrial fibrillation profound bradycardia rate 25          Follow-up EKG paced rhythm  ASSESSMENT AND PLAN:  Complete heart block Extreme bradycardia Respiratory failure SLE Antiphospholipid syndrome CVA ,status post PFO closure 30-pack-year smoking Difficulty swallowing resulting in PEG tube placement Hypotension Elevated LFTs . Plan Agree with ICU level care support Continue respiratory critical care support on the vent wean when appropriate Continue pressor  therapy for blood pressure support Hydration for hypotension dehydration External pacer in place Recommend placement of internal pacemaker temporary Echocardiogram for assessment left ventricular function wall motion Rule out myocardial infarction Recommend transfer to tertiary care center at husband's request  Signed: Alwyn Pea MD 10/06/2020, 1:19 PM

## 2020-10-06 NOTE — Progress Notes (Signed)
Patient transferring to [facility], report called to Kirstie Mirza, RN.   Report given Mora Bellman, RN with [facility].   Will notify husband of transfer.

## 2020-10-06 NOTE — Procedures (Signed)
Arterial Catheter Insertion Procedure Note  Mallory Riley  [id number]  [birth year]  Date:10/06/20  Time:2:40 AM    Provider Performing: Lianne Cure    Procedure: Insertion of Arterial Line (01410) with US guidance (30131)   Indication(s) Blood pressure monitoring and/or need for frequent ABGs  Consent Unable to obtain consent due to emergent nature of procedure.  Anesthesia None   Time Out Verified patient identification, verified procedure, site/side was marked, verified correct patient position, special equipment/implants available, medications/allergies/relevant history reviewed, required imaging and test results available.   Sterile Technique Maximal sterile technique including full sterile barrier drape, hand hygiene, sterile gown, sterile gloves, mask, hair covering, sterile ultrasound probe cover (if used).   Procedure Description Area of catheter insertion was cleaned with chlorhexidine and draped in sterile fashion. With real-time ultrasound guidance an arterial catheter was placed into the left femoral artery.  Appropriate arterial tracings confirmed on monitor.     Complications/Tolerance None; patient tolerated the procedure well.   EBL Minimal   Specimen(s) None  Mallory Riley, AGNP  Pulmonary/Critical Care Pager [phone] (please enter 7 digits) PCCM Consult Pager [phone] (please enter 7 digits)

## 2020-10-06 NOTE — Procedures (Signed)
Central Venous Catheter Insertion Procedure Note  Mallory Riley  [id number]  [birth year]  Date:10/06/20  Time:4:29 AM   Provider Performing:Lorayne Getchell A Bethel Sirois   Procedure: Insertion of Non-tunneled Central Venous Catheter(36556) with US guidance (66294)   Indication(s) Medication administration and Difficult access  Consent Unable to obtain consent due to emergent nature of procedure.  Anesthesia Topical only with 1% lidocaine   Timeout Verified patient identification, verified procedure, site/side was marked, verified correct patient position, special equipment/implants available, medications/allergies/relevant history reviewed, required imaging and test results available.  Sterile Technique Maximal sterile technique including full sterile barrier drape, hand hygiene, sterile gown, sterile gloves, mask, hair covering, sterile ultrasound probe cover (if used).  Procedure Description Area of catheter insertion was cleaned with chlorhexidine and draped in sterile fashion.  With real-time ultrasound guidance a central venous catheter was placed into the left femoral vein. Nonpulsatile blood flow and easy flushing noted in all ports.  The catheter was sutured in place and sterile dressing applied.  Complications/Tolerance None; patient tolerated the procedure well. Chest X-ray is ordered to verify placement for internal jugular or subclavian cannulation.   Chest x-ray is not ordered for femoral cannulation.  EBL Minimal  Specimen(s) None   Sontag Silversmith, DNP, CCRN, FNP-C, AGACNP-BC Acute Care Nurse Practitioner  [facility] Pager: [phone] [place] [facility]

## 2020-10-06 NOTE — Progress Notes (Signed)
Initial Nutrition Assessment  DOCUMENTATION CODES:  Underweight  INTERVENTION:  Recommend the following TF regimen via PEG: Osmolite 1.5 at 74mL/h (1263mL/d) Free water 44mL q4h TF regimen will provide 1800kcal, 75g protein, of water (TF+flushes) Pt is at high risk for refeeding, monitor K, Mg, and phosphorus daily until normal. 100mg  thiamine x3 days, monitor for need for additional supplementation  NUTRITION DIAGNOSIS:  Underweight related to dysphagia, inability to eat as evidenced by other (comment) (BMI of 16.2).  GOAL:  Patient will meet greater than or equal to 90% of their needs  MONITOR:  Vent status, Labs, TF tolerance  REASON FOR ASSESSMENT:  Ventilator    ASSESSMENT:  50 y.o. female presented to ED for SOB and chest pain that started acutely the day of admission. Husband reports malaise and diarrhea prior to onset of CP. Noted PEG tube in place and is chronically underweight. PMH relevant for CVA, FTT, dysphagia  Pt taken to cath lab after arrival to ED after complete heart block was detected and attempted TVP procedure which was not successful. Shortly after, pt deteriorated requiring chest compressions and intubation.   Pt out of room in cath lab at the time of assessment. Discussed in ICU rounds, per MD, start feeds via PEG once pt returns to room.   Reviewed records from [facility]. Pt had PEG placed 04/2020 during hospitalization due to inability to consumed sufficient nutrition to maintain weight. Osmolite 1.5 utilized during that admission. Usual body weight prior to loss ~120 lbs. Outpatient follow-up notes that pt was doing 3 bolus feeds each day and drinking 1 ensure in addition to her PO at meals.   Pt is a high refeeding risk, recommend monitoring K, Mg, and phosphorus. Will add thiamine replacement x3 days and monitor for need for additional days of supplementation.  Patient is currently intubated on ventilator support MV: 5.7 L/min Temp  (24hrs), Avg:96.1 F (35.6 C), Min:95.4 F (35.2 C), Max:98.3 F (36.8 C)   Intake/Output Summary (Last 24 hours) at 10/06/2020 1125 Last data filed at 10/06/2020 10/08/2020 Gross per 24 hour  Intake 2217.05 ml  Output 500 ml  Net 1717.05 ml  Net IO Since Admission: 1,717.05 mL [10/06/20 1125]   Nutritionally Relevant Medications: Continuous Infusions:  DOPamine 20 mcg/kg/min (10/06/20 0948)   famotidine (PEPCID) IV 20 mg (10/06/20 0915)   fentaNYL infusion INTRAVENOUS 75 mcg/hr (10/06/20 0821)   midazolam 4 mg/hr (10/06/20 0908)   norepinephrine (LEVOPHED) Adult infusion 4 mcg/min (10/06/20 0859)   PRN Meds: docusate sodium, polyethylene glycol  Labs Reviewed: SBG ranges from 98-254 mg/dL over the last 24 hours Bun 25 Phosphorus 4.9 Mg 1.5  NUTRITION - FOCUSED PHYSICAL EXAM: Defer to follow-up assessment.  Diet Order:   Diet Order             Diet NPO time specified  Diet effective now                   EDUCATION NEEDS:  No education needs have been identified at this time  Skin:  Skin Assessment: Reviewed RN Assessment (incision from catheter placement)  Last BM:  PTA  Height:  Ht Readings from Last 1 Encounters:  10/06/20 5\' 4"  (1.626 m)    Weight:  Wt Readings from Last 1 Encounters:  10/06/20 42.8 kg    Ideal Body Weight:  54.5 kg  BMI:  Body mass index is 16.2 kg/m.  Estimated Nutritional Needs:  Kcal:  1500-1700 kcal/d Protein:  75-90 g/d Fluid:  >  1577mL/d  Greig Castilla, RD, LDN Clinical Dietitian Pager on Amion

## 2020-10-06 NOTE — Progress Notes (Signed)
   10/06/20 1230  Clinical Encounter Type  Visited With Patient and family together  Visit Type Initial;Spiritual support;Social support  Referral From Chaplain  Consult/Referral To Chaplain  Spiritual Encounters  Spiritual Needs Prayer;Emotional   Chaplain Randal Yepiz visited PT and her husband who was at bedside. PT's husband was able to tell the history of PT's health. He was able to express his concerns. The husband spoke of his wife being a miracle, and spoke her almost dying at least 3x's, but God brought her back. He spoke of his strong belief in the Almighty power of God. Chaplain validated his feelings and provided intentional presence, & prayer.

## 2020-10-06 NOTE — H&P (Signed)
NAME:  Mallory Riley, MRN:  [id number], DOB:  07/18/1970, LOS: 1 ADMISSION DATE:  10/05/2020, CONSULTATION DATE:  10/06/2020 REFERRING MD:  Sharman Cheek MD, CHIEF COMPLAINT:  SOB, Chest pain    History of present illness   Ms. Mallory Riley is a 50 y.o. female with history of SLE, antiphospholipid syndrome, CVA, PFO stats post closure in 2018, 30 pack year smoking history, difficulty swallowing/vocal cord dysfunction status post PEG tube who  presented to the ED new onset of diarrhea chief complaints of shortness of breath and chest pain.  Patient is currently intubated history obtained from patient's husband.  Per patient's husband, patient is originally from [place] and were in town to take custody of daughter when she developed acute onset shortness of breath and chest pain.  Patient's husband report new onset of diarrhea  since early morning of 6/29. She was walking and going to the bathroom on her own but suddenly became weak and not feeling well. He noticed that she was slumped in the chair heaving as if she wanted to vomit. She also had an episode of bowel incontinent and c/o sob, and not being able to breath prior to vomiting. Patient's husband checked her carotid pulse and noticed that it was unusually slow and irregular. He therefore brought her to the ED for further evaluation.  ED: On arrival to the ED, he was afebrile with blood pressure  (!) 69/60mm Hg and pulse rate(!) 31  beats/min resp (!) 24. There were no focal neurological deficits; patient was lethargic, groaning and agonal.  Initial EKG showing complete heart block with a ventricular rate of 20.  Atropine, calcium and bicarb was administered and patient started on transcutaneous pacing with good capture at 70 mA for a peripheral pulse rate of 80 with normalization of blood pressure initially.  Patient did start to vomit and due to concerns for possible not able to protect airway she was intubated in the ED. Cardiology was  consulted and patient taken to the cath lab for temporary pacemaker placement. While in the cathlab, pacer was unable to capture and therefore she became symptomatic and unresponsive. CPR/ACLS initiated with 1 round of chest compression and Epinephrine prior to ROSC. She was started on Dopamine infusion gtt and transported to the ICU.  Labs/Diagnostics WBC/Hgb/Hct/Plts:  12.4/11.3/33.8/201 (06/30 0350)  Sodium 134, glucose 132, BUN 25, calcium 8.5, AST 73, ALT 108 Troponin:90>159 EKG: Complete heart block with ventricular rate of 24. There is inferior ST depression.  No clear STEMI. 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 Line 6/30: Left Femoral CVC  Significant Diagnostic Tests:  6/29: Chest Xray>No acute cardiopulmonary disease 6/29: Abdominal xray> 6/29: Noncontrast CT head  Micro Data:  6/29: SARS-CoV-2 PCR>> negative 6/29: Influenza PCR>> negative 6/30: Blood culture x2>> 6/30: Urine Culture>> 6/30: MRSA PCR>>   Antimicrobials:  None  OBJECTIVE  Blood pressure 103/77, pulse 79, temperature (!) 97.1 F (36.2 C), temperature source Axillary, resp. rate 17, height 5\' 4"  (1.626 m), weight 42.8 kg, SpO2 100 %.    Vent  Mode: PRVC FiO2 (%):  [65 %-100 %] 100 % Set Rate:  [14 bmp] 14 bmp Vt Set:  [400 mL] 400 mL PEEP:  [5 cmH20] 5 cmH20   Intake/Output Summary (Last 24 hours) at 10/06/2020 5697 Last data filed at 10/06/2020 0500 Gross per 24 hour  Intake  2141.57 ml  Output 350 ml  Net 1791.57 ml   Filed Weights   10/05/20 2119 10/06/20 0400  Weight: 40.8 kg 42.8 kg     Physical Examination  GENERAL: 50 year-old critically ill patient lying in the bed intubated, mechanically ventilated and sedated EYES: Pupils equal, round, reactive to light and accommodation. No scleral icterus. Extraocular muscles intact.  HEENT: Head atraumatic, normocephalic. Oropharynx and nasopharynx clear.  NECK:  Supple, no jugular venous distention. No thyroid enlargement, no tenderness.  LUNGS: Normal breath sounds bilaterally, no wheezing, rales,rhonchi or crepitation. No use of accessory muscles of respiration.  CARDIOVASCULAR: S1, S2 normal. No murmurs, rubs, or gallops.  ABDOMEN: Soft, nontender, nondistended. Bowel sounds present. No organomegaly or mass.  PEG tube in place WNL EXTREMITIES: No pedal edema, cyanosis, or clubbing.  NEUROLOGIC: Cranial nerves II through XII are intact.  Unable to assess muscle strength . Sensation intact. Gait not checked.  PSYCHIATRIC: Unable to assess SKIN: No obvious rash, lesion, or ulcer.   Labs/imaging that I havepersonally reviewed  (right click and "Reselect all SmartList Selections" daily)    Labs   CBC: Recent Labs  Lab 10/05/20 2123 10/06/20 0350  WBC 9.9 12.4*  NEUTROABS 5.3  --   HGB 11.9* 11.3*  HCT 35.8* 33.8*  MCV 99.2 98.3  PLT 231 201    Basic Metabolic Panel: Recent Labs  Lab 10/05/20 2123 10/06/20 0350  NA 134* 138  K 4.1 4.1  CL 105 108  CO2 22 25  GLUCOSE 182* 254*  BUN 25* 25*  CREATININE 0.73 0.67  CALCIUM 8.5* 8.2*  MG  --  1.5*  PHOS  --  4.9*   GFR: Estimated Creatinine Clearance: 56.8 mL/min (by C-G formula based on SCr of 0.67 mg/dL). Recent Labs  Lab 10/05/20 2123 10/06/20 0350  WBC 9.9 12.4*    Liver Function Tests: Recent Labs  Lab 10/05/20 2123 10/06/20 0350  AST 73* 206*  ALT 108* 285*  ALKPHOS 57 104  BILITOT 0.6 0.6  PROT 5.5* 5.2*  ALBUMIN 3.2*  2.9*   No results for input(s): LIPASE, AMYLASE in the last 168 hours. No results for input(s): AMMONIA in the last 168 hours.  ABG No results found for: PHART, PCO2ART, PO2ART, HCO3, TCO2, ACIDBASEDEF, O2SAT   Coagulation Profile: Recent Labs  Lab 10/05/20 2123  INR 1.6*    Cardiac Enzymes: No results for input(s): CKTOTAL, CKMB, CKMBINDEX, TROPONINI in the last 168 hours.  HbA1C: No results found for: HGBA1C  CBG: No results for input(s): GLUCAP in the last 168 hours.  Review of Systems:   Unable to assess as patient is currently intubated, mechanically ventilated and sedated  Past Medical History  She,  has a past medical history of Antiphospholipid antibody syndrome (HCC), Anxiety and depression, CVA (cerebral vascular accident) (HCC), Dysphagia, Dysphonia, Failure to thrive in adult, Migraine headache without aura, and SLE (systemic lupus erythematosus related syndrome) (HCC).   Surgical History   History reviewed. No pertinent surgical history.   Social History   reports that she has never smoked. She has never used smokeless tobacco. She reports that she does not drink alcohol and does not use drugs.  Family History   Her family history is not on file.   Allergies No Known Allergies   Home Medications  Prior to Admission medications   Not on File    Scheduled Meds: Continuous Infusions:  DOPamine     famotidine (PEPCID) IV     fentaNYL infusion INTRAVENOUS 75 mcg/hr (10/06/20 0500)   magnesium sulfate bolus IVPB     midazolam 2.5 mg/hr (10/06/20 0300)   norepinephrine (LEVOPHED) Adult infusion 4 mcg/min (10/06/20 0500)   vasopressin Stopped (10/06/20 0347)   PRN Meds:.docusate sodium, fentaNYL (SUBLIMAZE) injection, fentaNYL (SUBLIMAZE) injection, midazolam, polyethylene glycol   Active Hospital Problem list    Assessment & Plan:  Mechanical ventillation for airway protection s/p sinus arrest -Wean PEEP & FiO2 as tolerated, maintain SpO2 >  90% -Head of bed elevated 30 degrees, VAP protocol in place -Plateau pressures less than 30 cm H20  -Intermittent chest x-ray & ABG PRN -Daily WUA with SBT as tolerated  -Ensure adequate pulmonary hygiene  -PAD protocol in place: continue Fentanyl & versed gtt  Symptomatic bradycardia in the setting of complete heart block status post temporary transcutaneous pacemaker Cardiogenic Shock S/p Sinus arrest  -Continuous cardiac monitoring -Transcutaneous pacing, followed by transvenous pacing per cards -Start dopamine IV 3 mics per cake per minute titrate up to 20 to keep MAP greater than 65 mm per Hg -Treat underlying etiology -Avoid AV nodal blocking agents -Obtain TTE echocardiogram -Will check TSH -Cardiology consulted, following input appreciated  Elevated Troponin likely demand in the setting of above -Trend troponin -Serial EKGs  Acute encephalopathy secondary to severe hypoperfusion from above -supportive care -Maintain RASS at 0 -Avoid sedatives as able  Elevated LFTs Likely secondary to liver hypoperfusion from above -Trend LFTs -Treat underlying cause as above  Severe dysphagia Dysphonia with concerns for vocal cord dysfunction s/p botox injection Adult failure to thrive with severe protein calorie malnutrition related to chronic condition status post PEG placement 1/5 -Follow-up prealbumin -Dietary consult  SLE antiphospholipid lipid syndrome  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 -Check PT/INR -Continue Lovenox and Coumadin bridge per pharmacy   Best practice:  Diet:  NPO Pain/Anxiety/Delirium protocol (if indicated): Yes (RASS goal 0) VAP protocol (if indicated): Yes DVT prophylaxis: Systemic AC GI prophylaxis: PPI Glucose control:  SSI No Central venous access:  Yes, and it is still needed Arterial line:  Yes, and it is still needed Foley:  Yes, and it is still needed Mobility:  bed rest  PT consulted: N/A Last date of  multidisciplinary goals of care discussion [6/30] Code Status:  full code Disposition: ICU   = Goals of Care = Code Status Order: @CODE @   Primary Emergency Contact: Vermette,Daniel tel: [phone] Wishes to pursue full aggressive treatment and intervention options, including CPR and intubation, updated the husband on current treatment plans and prognosis.   Critical care time: 45 minutes     [phone], DNP, CCRN, FNP-C, AGACNP-BC Acute Care Nurse Practitioner  [facility] Pager: [phone] [facility]  .

## 2020-10-06 NOTE — Progress Notes (Signed)
GOALS OF CARE DISCUSSION  The Clinical status was relayed to family in detail. Husband at bedside  Updated and notified of patients medical condition.    Patient remains unresponsive and will not open eyes to command.   Explained to family course of therapy and the modalities  Continue vent support, Extremal pacing Transfer to [facility] pending Risks of transfer relayed to husband high risk for cardiac arrest and death while transferring He understands   Patient with Progressive multiorgan failure with a very high probablity of a very minimal chance of meaningful recovery despite all aggressive and optimal medical therapy.  PATIENT REMAINS FULL CODE  Family understands the situation.   Family are satisfied with Plan of action and management. All questions answered  Additional CC time 25 mins   Janet Decesare Santiago Glad, M.D.  [facility]  Medical Director [facility] Medical Director [facility] Cardio-Pulmonary Department

## 2020-10-06 NOTE — Progress Notes (Signed)
Right IJ left in over night

## 2020-10-06 NOTE — Progress Notes (Signed)
*  PRELIMINARY RESULTS* Echocardiogram 2D Echocardiogram has been performed.  Cristela Blue 10/06/2020, 1:25 PM

## 2020-10-06 NOTE — Progress Notes (Signed)
Brief Cardiology Progress Note   Pt accepted in transfer to [facility] CCU  Attending Dr Cheral Bay Awaiting bed assignment and transport

## 2020-10-06 NOTE — Progress Notes (Signed)
Dr. Juliann Pares at bedside. RT called to transport patient from ED to the Cardiac Cath Lab for procedure. Transported without incident. Patient transferred to procedure table. Post procedure, patient developed low bp and faint heart rate in the 30's. Decision was made to give epi and begin chest compressions. Patient regained strong palpable pulse shortly after epi and brief compressions. ICU NP at bedside. Patient transported to ICU for further care. Will continue to monitor.

## 2020-10-06 NOTE — Discharge Summary (Signed)
Physician Discharge Summary  Patient ID: Mallory Riley MRN: [id number] DOB/AGE: January 17, 1971 50 y.o.  Admit date: 10/05/2020 Discharge date: 10/06/2020  Admission Diagnoses:Symptomatic bradycardia r/t complete heart block s/p temporary pacemaker  Discharge Diagnoses:  Active Problems:   Complete heart block [facility])   Discharged Condition: critical  Hospital Course:  HPI: Mallory Riley is a 50 y.o. female with history of SLE, antiphospholipid syndrome, CVA, PFO stats post closure in 2018, 30 pack year smoking history, difficulty swallowing/vocal cord dysfunction status post PEG tube who  presented to the ED new onset of diarrhea chief complaints of shortness of breath and chest pain.   Patient is currently intubated history obtained from patient's husband.  Per patient's husband, patient is originally from [place] and were in town to take custody of daughter when she developed acute onset shortness of breath and chest pain.  Patient's husband report new onset of diarrhea  since early morning of 6/29. She was walking and going to the bathroom on her own but suddenly became weak and not feeling well. He noticed that she was slumped in the chair heaving as if she wanted to vomit. She also had an episode of bowel incontinent and c/o sob, and not being able to breath prior to vomiting. Patient's husband checked her carotid pulse and noticed that it was unusually slow and irregular. He therefore brought her to the ED for further evaluation.   ED: On arrival to the ED, he was afebrile with blood pressure  (!) 69/63mm Hg and pulse rate(!) 31  beats/min resp (!) 24. There were no focal neurological deficits; patient was lethargic, groaning and agonal.  Initial EKG showing complete heart block with a ventricular rate of 20.  Atropine, calcium and bicarb was administered and patient started on transcutaneous pacing with good capture at 70 mA for a peripheral pulse rate of 80 with normalization of blood  pressure initially.  Patient did start to vomit and due to concerns for possible not able to protect airway she was intubated in the ED. Cardiology was consulted and patient taken to the cath lab for temporary pacemaker placement. While in the cathlab, pacer was unable to capture and therefore she became symptomatic and unresponsive. CPR/ACLS initiated with 1 round of chest compression and Epinephrine prior to ROSC. She was started on Dopamine infusion gtt and transported to the ICU.   Labs/Diagnostics WBC/Hgb/Hct/Plts:  12.4/11.3/33.8/201 (06/30 0350)  Sodium 134, glucose 132, BUN 25, calcium 8.5, AST 73, ALT 108 Troponin:90>159 EKG: Complete heart block with ventricular rate of 24. There is inferior ST depression.  No clear STEMI. 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 Line 6/30: Left Femoral CVC  Significant Diagnostic Studies:  6/29: Chest Xray>No acute cardiopulmonary disease 6/29: Abdominal xray>NG tube tip is in the stomach. Gastrostomy tube projects over the stomach. Lung bases clear 6/29: Noncontrast CT head> Chronic small  vessel disease without acute intracranial abnormality   Assessment & Plan:  Mechanical ventillation for airway protection s/p sinus arrest -Wean PEEP & FiO2 as  tolerated, maintain SpO2 > 90% -Head of bed elevated 30 degrees, VAP protocol in place -Plateau pressures less than 30 cm H20 -Intermittent chest x-ray & ABG PRN -Daily WUA with SBT as tolerated -Ensure adequate pulmonary hygiene  -PAD protocol in place: continue Fentanyl & versed gtt   Symptomatic bradycardia in the setting of complete heart block status post temporary transcutaneous pacemaker Cardiogenic Shock S/p PROLONGED Sinus arrest in the cath Lab suite requiring 1 round of CPR/ACLS with ROSC -Continuous cardiac monitoring -Transcutaneous pacing, followed by transvenous pacing per cards -Started on dopamine now off, Remains on Levophed to keep MAP >65 mm Hg -Treat underlying etiology -Avoid AV nodal blocking agents -Echocardiogram shows preserved LV function with LVH.  No high-grade valvular disease. -Will check TSH -Cardiology consulted, following input appreciated. Transfering to [facility] for permanent pacemaker placement. Accepting physician at [facility] Dr. Cheral Bay.   Elevated Troponin likely demand in the setting of above -Trend troponin -Serial EKGs   Acute encephalopathy secondary to severe hypoperfusion from above -supportive care -Maintain RASS at 0 -Avoid sedatives as able   Elevated LFTs Likely secondary to liver hypoperfusion from above -Trend LFTs -Treat underlying cause as above   Severe dysphagia Dysphonia with concerns for vocal cord dysfunction s/p botox injection Adult failure to thrive with severe protein calorie malnutrition related to chronic condition status post PEG placement 1/5 -Follow-up prealbumin -Dietary consult   SLE antiphospholipid lipid syndrome 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 -Check PT/INR -Continue Lovenox and Coumadin bridge per pharmacy    = Goals of Care = Code Status Order: FULL  Primary Emergency Contact: Vanderweele,Daniel tel: [phone] Wishes to pursue full aggressive treatment and  intervention options, including CPR and intubation, updated the husband on current treatment plans including transfer to [facility] for permanent pacemaker placement  Discharge Exam: Blood pressure 90/61, pulse 70, temperature (!) 100.58 F (38.1 C), resp. rate 15, height 5\' 4"  (1.626 m), weight 42.8 kg, SpO2 100 %.  GENERAL: 50 year-old critically ill patient lying in the bed intubated, mechanically ventilated and sedated EYES: Pupils equal, round, reactive to light and accommodation. No scleral icterus. Extraocular muscles intact. HEENT: Head atraumatic, normocephalic. Oropharynx and nasopharynx clear. NECK:  Supple, no jugular venous distention. No thyroid enlargement, no tenderness. LUNGS: Normal breath sounds bilaterally, no wheezing, rales,rhonchi or crepitation. No use of accessory muscles of respiration. CARDIOVASCULAR: S1, S2 normal. No murmurs, rubs, or gallops. ABDOMEN: Soft, nontender, nondistended. Bowel sounds present. No organomegaly or mass.  PEG tube in place WNL EXTREMITIES: No pedal edema, cyanosis, or clubbing. NEUROLOGIC: Cranial nerves II through XII are intact.  Unable to assess muscle strength . Sensation intact. Gait not checked. PSYCHIATRIC: Unable to assess SKIN: No obvious rash, lesion, or ulcer.  Disposition: Discharge disposition: 02-Transferred to [facility] Term [facility]  Discharge Instructions     Diet - low sodium heart healthy   Complete by: As directed       Allergies as of 10/06/2020   No Known Allergies      Medication List     STOP taking these medications    enoxaparin 40 MG/0.4ML injection Commonly known as: LOVENOX       TAKE these medications    ammonium lactate 12 % lotion Commonly known as: LAC-HYDRIN APPLY TO AFFECTED AREA TWICE A  DAY   atorvastatin 20 MG tablet Commonly known as: LIPITOR Take 20 mg by mouth daily.   buPROPion 200 MG 12 hr tablet Commonly known as: WELLBUTRIN SR Take 200  mg by mouth 2 (two) times daily.   chloroquine 250 MG tablet Commonly known as: ARALEN Take 250 mg by mouth daily.   gabapentin 100 MG capsule Commonly known as: NEURONTIN Take 200 mg by mouth at bedtime.   Galcanezumab-gnlm 120 MG/ML Soaj Inject into the skin.   hydroxychloroquine 200 MG tablet Commonly known as: PLAQUENIL Take 200 mg by mouth 2 (two) times daily.   Linzess 145 MCG Caps capsule Generic drug: linaclotide SMARTSIG:1 Capsule(s) Gastro Tube Every Morning   meclizine 25 MG tablet Commonly known as: ANTIVERT Take 25 mg by mouth 3 (three) times daily as needed.   montelukast 10 MG tablet Commonly known as: SINGULAIR Take 10 mg by mouth at bedtime.   Motegrity 2 MG Tabs Generic drug: Prucalopride Succinate Take 1 tablet by mouth daily.   omeprazole 20 MG capsule Commonly known as: PRILOSEC Take 20 mg by mouth daily.   ondansetron 8 MG disintegrating tablet Commonly known as: ZOFRAN-ODT Take 8 mg by mouth daily as needed.   promethazine 25 MG tablet Commonly known as: PHENERGAN Take 25 mg by mouth every 6 (six) hours as needed.   SUMAtriptan 100 MG tablet Commonly known as: IMITREX TAKE 1 TABLET AT ONSET OF MIGRAINE HEADACHE. MAY REPEAT IN 2 HOURS IF NEEDED.   SUMAtriptan 20 MG/ACT nasal spray Commonly known as: IMITREX SMARTSIG:1 Spray(s) Both Nares 1-2 Times Daily   topiramate 100 MG tablet Commonly known as: TOPAMAX Take 100 mg by mouth 2 (two) times daily.   venlafaxine XR 75 MG 24 hr capsule Commonly known as: EFFEXOR-XR Take 225 mg by mouth daily.   Vitamin D (Ergocalciferol) 1.25 MG (50000 UNIT) Caps capsule Commonly known as: DRISDOL Take 50,000 Units by mouth every 14 (fourteen) days.   warfarin 5 MG tablet Commonly known as: COUMADIN Take by mouth.          Critical care time: 45 minutes         Sebree Silversmith, DNP, CCRN, FNP-C, AGACNP-BC Acute Care Nurse Practitioner [facility] Pager:  [phone] [facility]

## 2020-10-07 MED FILL — Medication: Qty: 1 | Status: AC

## 2022-05-31 IMAGING — CT CT HEAD W/O CM
4 series · 16 of 47 positions shown, 18 images · non-contrast
Comparison: None.

CLINICAL DATA: Encephalopathy

EXAM:
CT HEAD WITHOUT CONTRAST
TECHNIQUE: Contiguous axial images were obtained from the base of the skull
through the vertex without intravenous contrast.

[Series 2: head wo · axial · 0.42mm/px · z∈[+432,+527]mm · 6 of 27 slices shown, 8 images]
[im 4/27  brain]
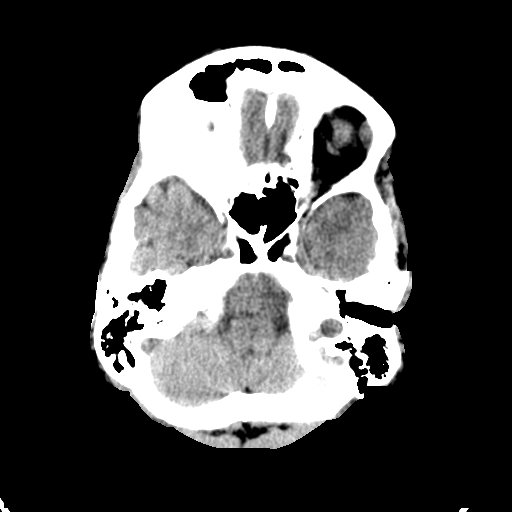
[im 4/27  bone]
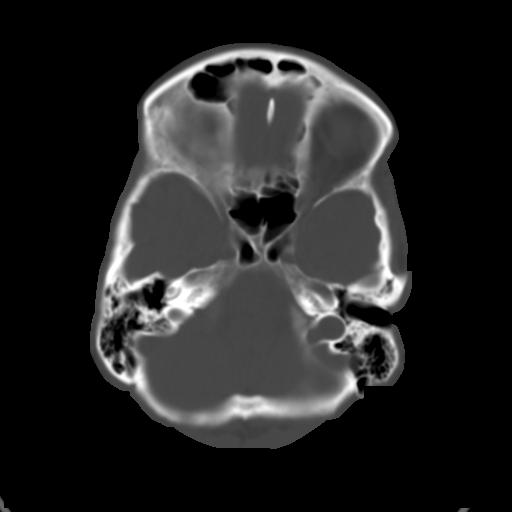
[im 8/27  brain]
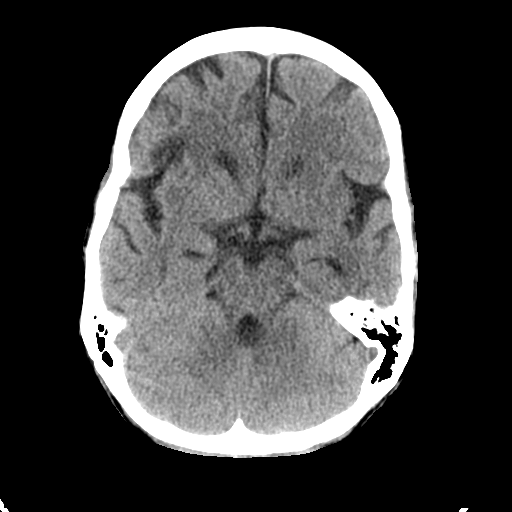
[im 12/27  brain]
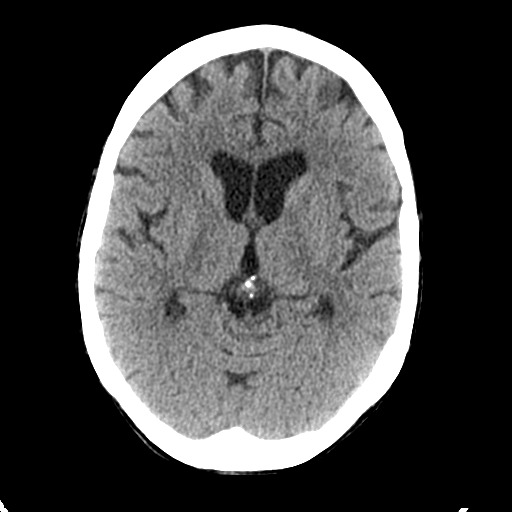
[im 15/27  brain]
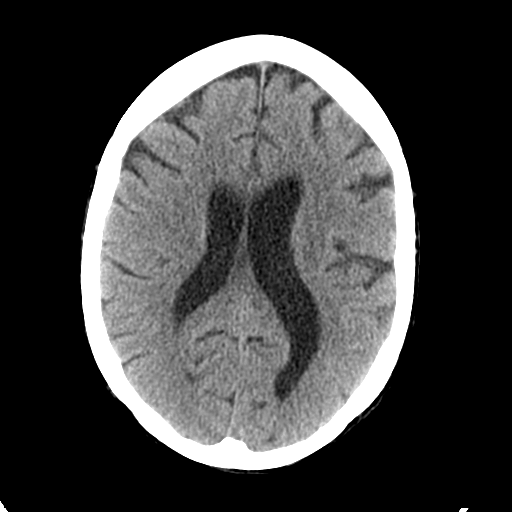
[im 19/27  brain]
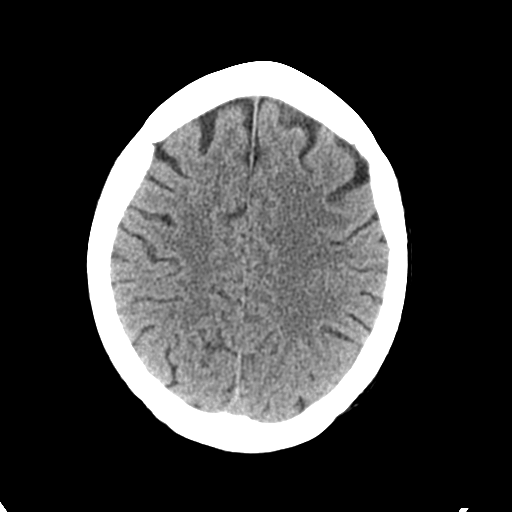
[im 19/27  bone]
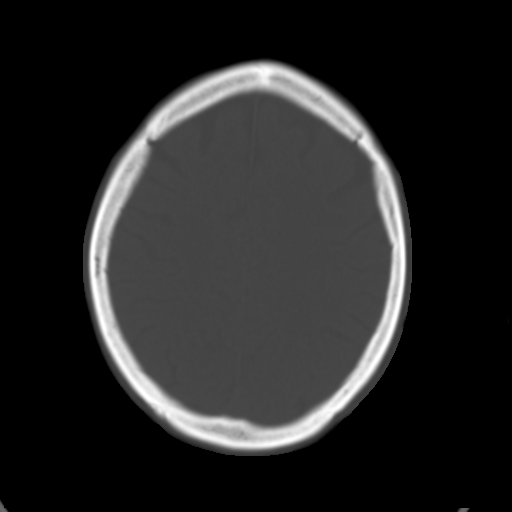
[im 23/27  brain]
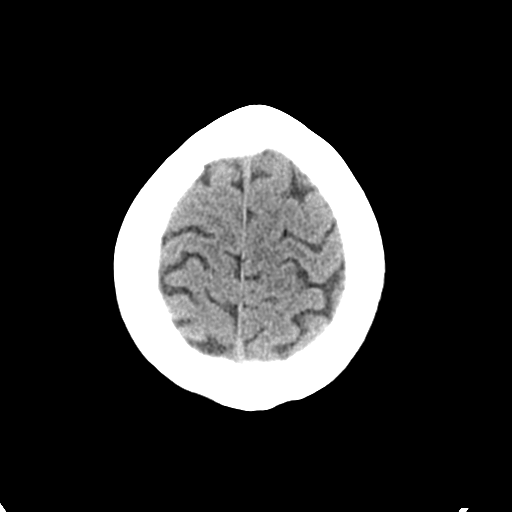

[Series 3: head bone · axial · 0.42mm/px · z∈[+429,+477]mm · 4 of 73 slices shown]
[im 7/73  bone]
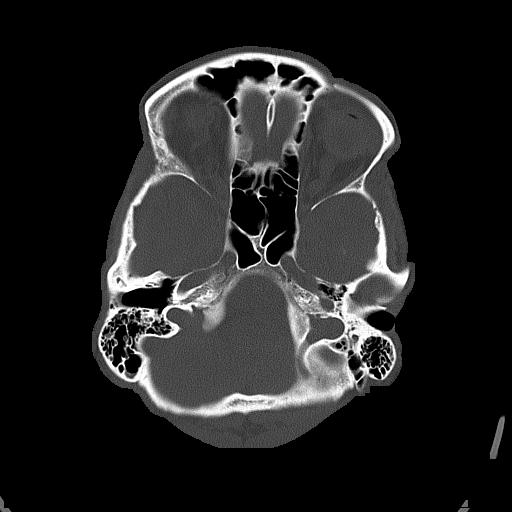
[im 14/73  bone]
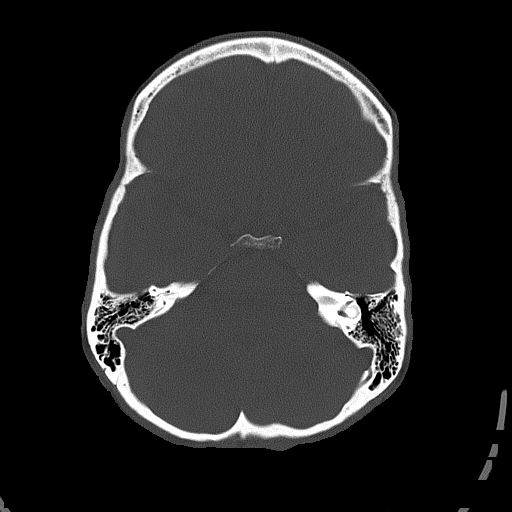
[im 25/73  bone]
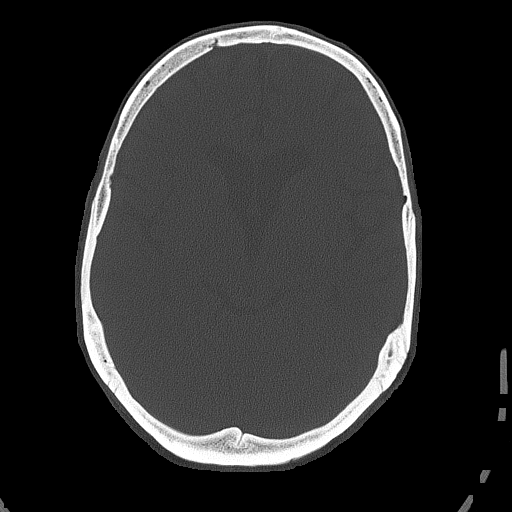
[im 31/73  bone]
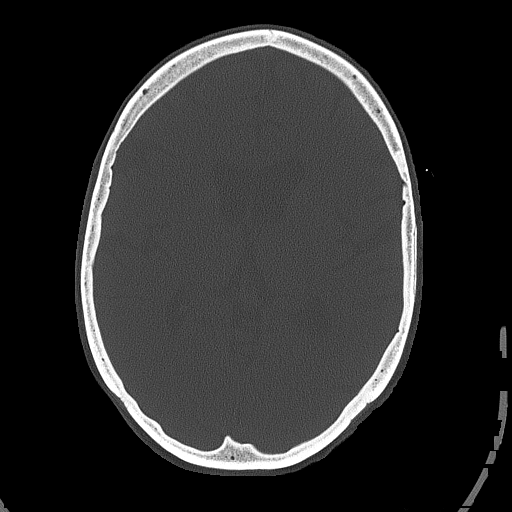

[Series 4: coronal soft tissue · coronal · 0.31mm/px · 3 of 64 slices shown]
[im 22/64  brain]
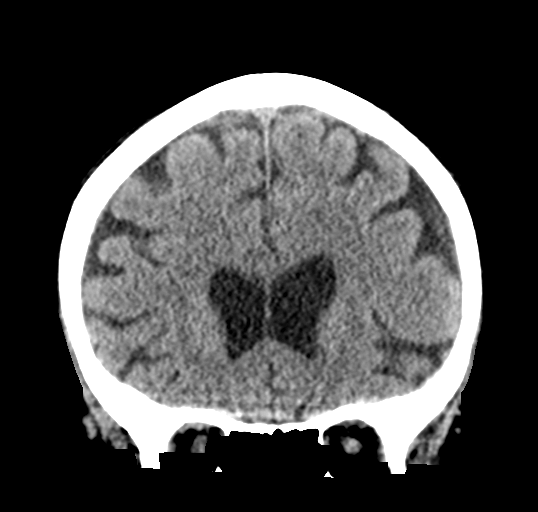
[im 29/64  brain]
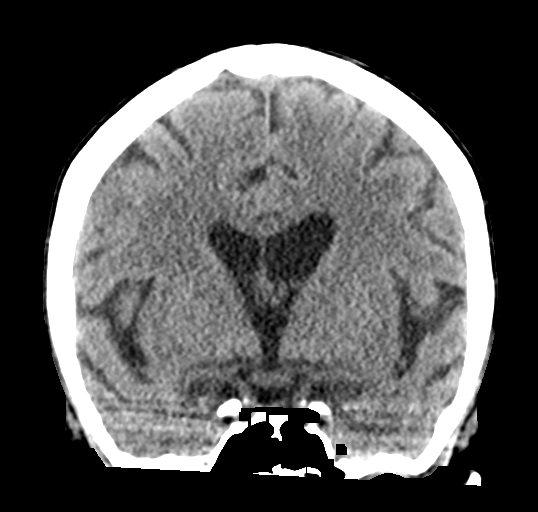
[im 36/64  brain]
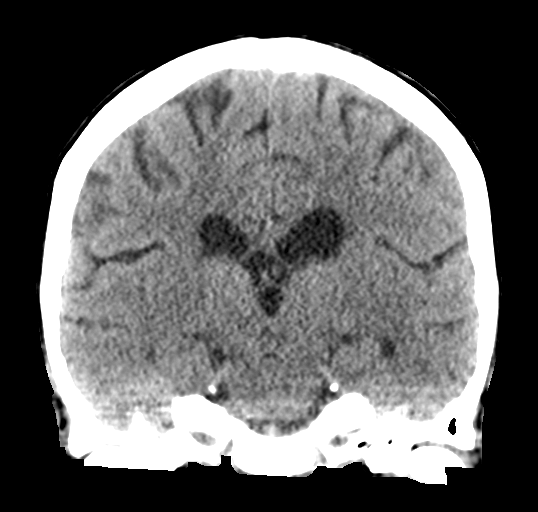

[Series 5: sagittal soft tissue · sagittal · 0.31mm/px · 3 of 55 slices shown]
[im 19/55  brain]
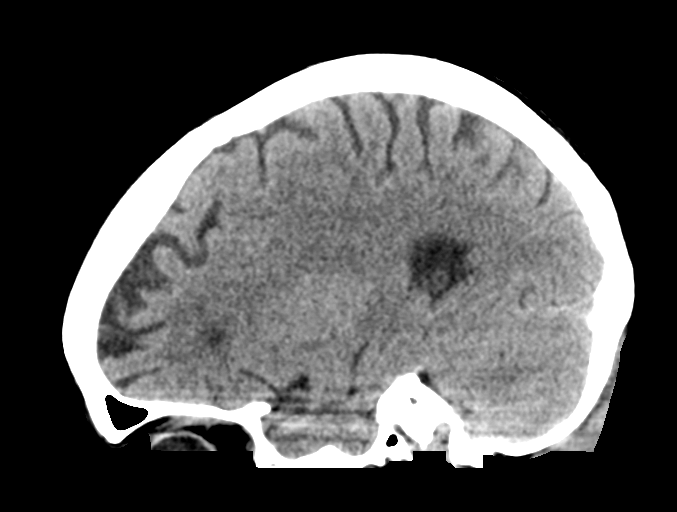
[im 28/55  brain]
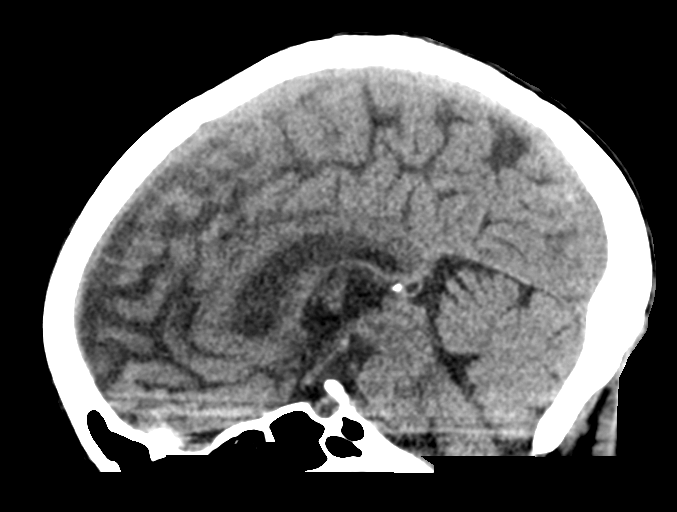
[im 37/55  brain]
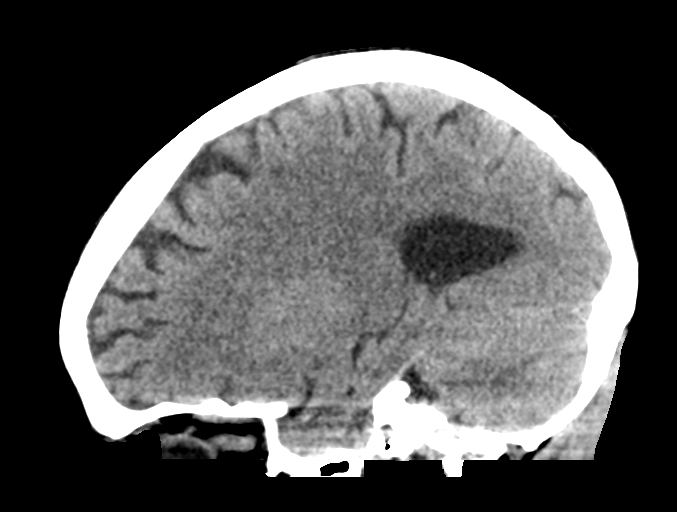

[16 of 47 positions shown; findings below may reference images not displayed]

FINDINGS: Brain: There is no mass, hemorrhage or extra-axial collection. The
size and configuration of the ventricles and extra-axial CSF spaces
are normal. There is hypoattenuation of the white matter, most
commonly indicating chronic small vessel disease.

Vascular: No abnormal hyperdensity of the major intracranial
arteries or dural venous sinuses. No intracranial atherosclerosis.

Skull: The visualized skull base, calvarium and extracranial soft
tissues are normal.

Sinuses/Orbits: No fluid levels or advanced mucosal thickening of
the visualized paranasal sinuses. No mastoid or middle ear effusion.
The orbits are normal.
IMPRESSION: Chronic small vessel disease without acute intracranial abnormality.

## 2022-06-01 IMAGING — DX DG CHEST 1V
1 series · 1 of 1 positions shown · non-contrast
Comparison: 10/05/2020 portable chest.

CLINICAL DATA: 49-year-old female with chest pain and shortness of
breath. Intubated. Pacemaker.

EXAM:
CHEST  1 VIEW

[chest ap]
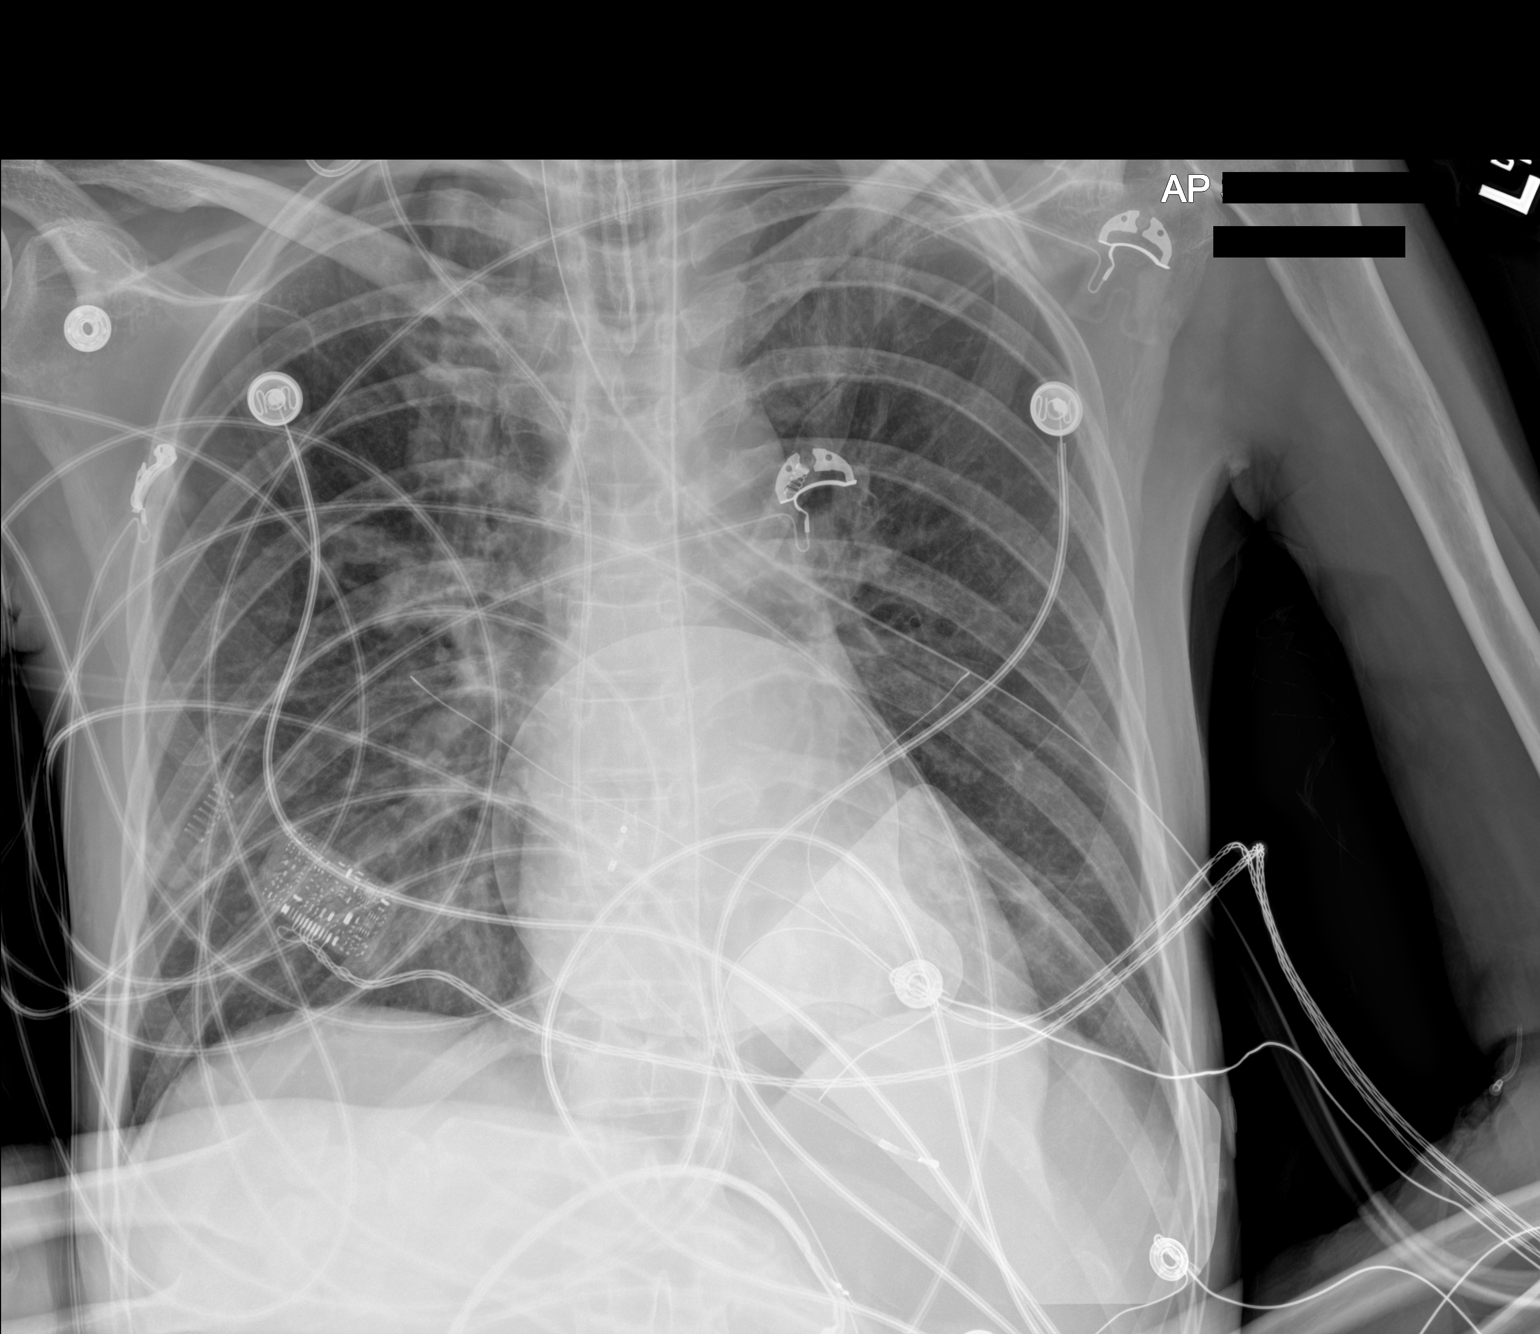

[1 of 1 positions shown; findings below may reference images not displayed]

FINDINGS: Portable AP semi upright view at [id number] hours. Endotracheal tube tip
is stable at the level the clavicles. Enteric tube courses to the
abdomen, tip not included. New right chest, subclavian versus IJ
approach cardiac pacemaker lead which terminates at the RV apex
region (arrows). Multiple EKG leads and pacer pads also project over
the chest. Mediastinal contours remain normal. No pneumothorax is
identified. Allowing for portable technique the lungs are clear. No
acute osseous abnormality identified.
IMPRESSION: 1. New right side cardiac pacemaker lead placed, tip at the RV apex
region. No pneumothorax.
2. Stable ET tube and enteric tube.
3. No acute cardiopulmonary abnormality.
